# Patient Record
Sex: Female | Born: 1940 | Race: White | Hispanic: No | Marital: Married | Smoking: Never smoker
Health system: Southern US, Community
[De-identification: ages and names within clinical notes are randomized; demographics above are authoritative.]

## PROBLEM LIST (undated history)

## (undated) DIAGNOSIS — IMO0002 Reserved for concepts with insufficient information to code with codable children: Secondary | ICD-10-CM

## (undated) DIAGNOSIS — Z972 Presence of dental prosthetic device (complete) (partial): Secondary | ICD-10-CM

## (undated) DIAGNOSIS — F32A Depression, unspecified: Secondary | ICD-10-CM

## (undated) DIAGNOSIS — G43909 Migraine, unspecified, not intractable, without status migrainosus: Secondary | ICD-10-CM

## (undated) DIAGNOSIS — K219 Gastro-esophageal reflux disease without esophagitis: Secondary | ICD-10-CM

## (undated) DIAGNOSIS — G4730 Sleep apnea, unspecified: Secondary | ICD-10-CM

## (undated) DIAGNOSIS — E119 Type 2 diabetes mellitus without complications: Secondary | ICD-10-CM

## (undated) DIAGNOSIS — H9190 Unspecified hearing loss, unspecified ear: Secondary | ICD-10-CM

## (undated) DIAGNOSIS — R0602 Shortness of breath: Secondary | ICD-10-CM

## (undated) DIAGNOSIS — F329 Major depressive disorder, single episode, unspecified: Secondary | ICD-10-CM

## (undated) DIAGNOSIS — Z973 Presence of spectacles and contact lenses: Secondary | ICD-10-CM

## (undated) DIAGNOSIS — R112 Nausea with vomiting, unspecified: Secondary | ICD-10-CM

## (undated) DIAGNOSIS — F419 Anxiety disorder, unspecified: Secondary | ICD-10-CM

## (undated) DIAGNOSIS — Z9889 Other specified postprocedural states: Secondary | ICD-10-CM

## (undated) DIAGNOSIS — E039 Hypothyroidism, unspecified: Secondary | ICD-10-CM

## (undated) DIAGNOSIS — I10 Essential (primary) hypertension: Secondary | ICD-10-CM

## (undated) DIAGNOSIS — H269 Unspecified cataract: Secondary | ICD-10-CM

## (undated) DIAGNOSIS — Z8719 Personal history of other diseases of the digestive system: Secondary | ICD-10-CM

## (undated) DIAGNOSIS — H409 Unspecified glaucoma: Secondary | ICD-10-CM

## (undated) DIAGNOSIS — M1990 Unspecified osteoarthritis, unspecified site: Secondary | ICD-10-CM

## (undated) HISTORY — PX: MULTIPLE TOOTH EXTRACTIONS: SHX2053

## (undated) HISTORY — PX: COLONOSCOPY: SHX174

## (undated) HISTORY — PX: TUBAL LIGATION: SHX77

## (undated) HISTORY — PX: JOINT REPLACEMENT: SHX530

## (undated) HISTORY — PX: DILATION AND CURETTAGE OF UTERUS: SHX78

## (undated) HISTORY — PX: TONSILLECTOMY: SUR1361

## (undated) HISTORY — PX: CATARACT EXTRACTION: SUR2

## (undated) HISTORY — PX: CARPAL TUNNEL RELEASE: SHX101

## (undated) HISTORY — PX: BACK SURGERY: SHX140

## (undated) HISTORY — PX: SHOULDER ARTHROSCOPY W/ ROTATOR CUFF REPAIR: SHX2400

---

## 2009-01-18 HISTORY — PX: LUMBAR DISC SURGERY: SHX700

## 2013-10-26 ENCOUNTER — Encounter (HOSPITAL_COMMUNITY): Payer: Self-pay | Admitting: Pharmacy Technician

## 2013-10-30 ENCOUNTER — Other Ambulatory Visit (HOSPITAL_COMMUNITY): Payer: Self-pay | Admitting: Orthopaedic Surgery

## 2013-10-31 ENCOUNTER — Encounter (HOSPITAL_COMMUNITY)
Admission: RE | Admit: 2013-10-31 | Discharge: 2013-10-31 | Disposition: A | Discharge status: Home / Self Care | Payer: Medicare PPO | Source: Ambulatory Visit | Attending: Orthopaedic Surgery | Admitting: Orthopaedic Surgery

## 2013-10-31 ENCOUNTER — Encounter (HOSPITAL_COMMUNITY): Payer: Self-pay

## 2013-10-31 DIAGNOSIS — H4089 Other specified glaucoma: Secondary | ICD-10-CM | POA: Diagnosis not present

## 2013-10-31 DIAGNOSIS — M1388 Other specified arthritis, other site: Secondary | ICD-10-CM | POA: Diagnosis not present

## 2013-10-31 DIAGNOSIS — M4310 Spondylolisthesis, site unspecified: Secondary | ICD-10-CM

## 2013-10-31 DIAGNOSIS — G4730 Sleep apnea, unspecified: Secondary | ICD-10-CM | POA: Insufficient documentation

## 2013-10-31 DIAGNOSIS — I10 Essential (primary) hypertension: Secondary | ICD-10-CM | POA: Diagnosis not present

## 2013-10-31 DIAGNOSIS — F329 Major depressive disorder, single episode, unspecified: Secondary | ICD-10-CM | POA: Diagnosis not present

## 2013-10-31 DIAGNOSIS — E119 Type 2 diabetes mellitus without complications: Secondary | ICD-10-CM | POA: Insufficient documentation

## 2013-10-31 DIAGNOSIS — M4806 Spinal stenosis, lumbar region: Secondary | ICD-10-CM | POA: Insufficient documentation

## 2013-10-31 DIAGNOSIS — F419 Anxiety disorder, unspecified: Secondary | ICD-10-CM | POA: Insufficient documentation

## 2013-10-31 DIAGNOSIS — Z0000 Encounter for general adult medical examination without abnormal findings: Secondary | ICD-10-CM | POA: Insufficient documentation

## 2013-10-31 DIAGNOSIS — K219 Gastro-esophageal reflux disease without esophagitis: Secondary | ICD-10-CM | POA: Diagnosis not present

## 2013-10-31 DIAGNOSIS — E039 Hypothyroidism, unspecified: Secondary | ICD-10-CM | POA: Insufficient documentation

## 2013-10-31 HISTORY — DX: Essential (primary) hypertension: I10

## 2013-10-31 HISTORY — DX: Depression, unspecified: F32.A

## 2013-10-31 HISTORY — DX: Unspecified osteoarthritis, unspecified site: M19.90

## 2013-10-31 HISTORY — DX: Hypothyroidism, unspecified: E03.9

## 2013-10-31 HISTORY — DX: Major depressive disorder, single episode, unspecified: F32.9

## 2013-10-31 HISTORY — DX: Personal history of other diseases of the digestive system: Z87.19

## 2013-10-31 HISTORY — DX: Gastro-esophageal reflux disease without esophagitis: K21.9

## 2013-10-31 HISTORY — DX: Sleep apnea, unspecified: G47.30

## 2013-10-31 HISTORY — DX: Anxiety disorder, unspecified: F41.9

## 2013-10-31 HISTORY — DX: Shortness of breath: R06.02

## 2013-10-31 HISTORY — DX: Unspecified glaucoma: H40.9

## 2013-10-31 LAB — COMPREHENSIVE METABOLIC PANEL
ALK PHOS: 81 U/L (ref 39–117)
ALT: 12 U/L (ref 0–35)
AST: 16 U/L (ref 0–37)
Albumin: 3.7 g/dL (ref 3.5–5.2)
Anion gap: 12 (ref 5–15)
BUN: 24 mg/dL — ABNORMAL HIGH (ref 6–23)
CHLORIDE: 101 meq/L (ref 96–112)
CO2: 27 meq/L (ref 19–32)
Calcium: 10.5 mg/dL (ref 8.4–10.5)
Creatinine, Ser: 0.66 mg/dL (ref 0.50–1.10)
GFR calc Af Amer: >90 mL/min (ref >90)
GFR, EST NON AFRICAN AMERICAN: 84 mL/min — AB (ref >90)
Glucose, Bld: 101 mg/dL — ABNORMAL HIGH (ref 70–99)
POTASSIUM: 3.8 meq/L (ref 3.7–5.3)
Sodium: 140 mEq/L (ref 137–147)
Total Protein: 7.5 g/dL (ref 6.0–8.3)

## 2013-10-31 LAB — CBC
HCT: 42 % (ref 36.0–46.0)
Hemoglobin: 14 g/dL (ref 12.0–15.0)
MCH: 29.9 pg (ref 26.0–34.0)
MCHC: 33.3 g/dL (ref 30.0–36.0)
MCV: 89.6 fL (ref 78.0–100.0)
PLATELETS: 214 10*3/uL (ref 150–400)
RBC: 4.69 MIL/uL (ref 3.87–5.11)
RDW: 13.2 % (ref 11.5–15.5)
WBC: 9.9 10*3/uL (ref 4.0–10.5)

## 2013-10-31 LAB — PROTIME-INR
INR: 0.99 (ref 0.00–1.49)
PROTHROMBIN TIME: 13.2 s (ref 11.6–15.2)

## 2013-10-31 LAB — SURGICAL PCR SCREEN
MRSA, PCR: NEGATIVE
Staphylococcus aureus: NEGATIVE

## 2013-10-31 NOTE — Progress Notes (Signed)
Pt. Never been seen by cardiologist, states she had a baseline stress test [place] y[place]. [place]go [place]  Pt. Se[place] by PCP- [place]. Valla [place]ockley [place] Perhaps had an EKG at Dr. Aline AugustShockley's office, pt. Doesn't know how long ago it was. Sleep study- 3 yrs. Ago or more, no longer using CPAP for apnea.

## 2013-10-31 NOTE — Pre-Procedure Instructions (Signed)
[place]  10/31/2013   Your procedure is scheduled on:  11/05/2013  [facility] at 10:30 AM.  Call this number if you have problems the morning of surgery: [phone]   Remember:   Do not eat food or drink liquids after midnight.  On Sunday NIGHT   Take these medicines the morning of surgery with A SIP OF WATER: clonazepam, pain medicine is OK if needed morning of surgery , synthroid, prilosec, Effexor   Do not wear jewelry, make-up or nail polish.  Do not wear lotions, powders, or perfumes. You may wear deodorant.  Do not shave 48 hours prior to surgery.   Do not bring valuables to the hospital.  [facility] is not responsible                  for any belongings or valuables.               Contacts, dentures or bridgework may not be worn into surgery.  Leave suitcase in the car. After surgery it may be brought to your room.  For patients admitted to the hospital, discharge time is determined by your                treatment team.               Patients discharged the day of surgery will not be allowed to drive  home.  Name and phone number of your driver: /w family  Special Instructions: Special Instructions: [facility] [place] - Preparing for Surgery  Before surgery, you can play an important role.  Because skin is not sterile, your skin needs to be as free of germs as possible.  You can reduce the number of germs on you skin by washing with CHG (chlorahexidine gluconate) soap before surgery.  CHG is an antiseptic cleaner which kills germs and bonds with the skin to continue killing germs even after washing.  Please DO NOT use if you have an allergy to CHG or antibacterial soaps.  If your skin becomes reddened/irritated stop using the CHG and inform your nurse when you arrive at Short Stay.  Do not shave (including legs and underarms) for at least 48 hours prior to the first CHG shower.  You may shave your face.  Please follow these instructions  carefully:   1.  Shower with CHG Soap the night before surgery and the  morning of Surgery.  2.  If you choose to wash your hair, wash your hair first as usual with your  normal shampoo.  3.  After you shampoo, rinse your hair and body thoroughly to remove the  Shampoo.  4.  Use CHG as you would any other liquid soap.  You can apply chg directly to the skin and wash gently with scrungie or a clean washcloth.  5.  Apply the CHG Soap to your body ONLY FROM THE NECK DOWN.    Do not use on open wounds or open sores.  Avoid contact with your eyes, ears, mouth and genitals (private parts).  Wash genitals (private parts)   with your normal soap.  6.  Wash thoroughly, paying special attention to the area where your surgery will be performed.  7.  Thoroughly rinse your body with warm water from the neck down.  8.  DO NOT shower/wash with your normal soap after using and rinsing off   the CHG Soap.  9.  Pat yourself dry with a clean towel.  10.  Wear clean pajamas.            11.  Place clean sheets on your bed the night of your first shower and do not sleep with pets.  Day of Surgery  Do not apply any lotions/deodorants the morning of surgery.  Please wear clean clothes to the hospital/surgery center.   Please read over the following fact sheets that you were given: Pain Booklet, Coughing and Deep Breathing, MRSA Information and Surgical Site Infection Prevention

## 2013-10-31 NOTE — H&P (Signed)
[facility]   A Division of [facility]   [address], [place], [place] [address] Telephone: [phone]  Fax: [phone]     PATIENT: Amy Curtis, Amy Curtis   MR#: [id number]  DOB: Dec 13, 1940   Visit Date: 10/25/2013     A 73 year old female returns and states her back is giving her severe problems.  She is having difficulty walking and has a cane.  Whenever she turns or shifts she feels sharp electrical type pain that shoots down to her buttocks and into her anterior thighs stopping by her knees and occasionally goes down past her knees to her foot.  She had an injection in her shoulder that gave her some improvement.  She has known full-thickness rotator cuff on the right shoulder with retraction.  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  She denies any associated bowel or bladder symptoms other than having difficulty getting on and off the toilet.   MEDICATIONS:  Patient's med list was reviewed and is identical to 03/29/2013 including Relafen 750 mg b.i.d., lisinopril 20 mg daily, venlafaxine 75 mg 1 tablet b.i.d., levothyroxine 25 mcg 1 each morning, omeprazole 40 mg daily, carvodopa b.i.d., verapamil HCL 180 mg 1 p.o. b.i.d., chlorthalidone 1 p.o. daily 25 mg, Xalatan 0.005% one eyedrop each eye, dorzolamide HCL ophthalmic drops 1 drop each eye every morning 2.24%/0.68% and hydrocodone 10/325 one tablet daily occasionally a second tablet.   PAST SURGICAL HISTORY:  Include L4-5 surgery 5 years ago [place] with good relief with the severe pain and inability to ambulate secondary to recurrent symptoms, tonsillectomy age 89, D&C when she had menopause, rotator cuff surgery and carpal tunnel.   FAMILY HISTORY:  Positive for brother with heart disease, mother with lung disease.   SOCIAL HISTORY:  She is married to her husband Renae Fickleaul who is not in good shape.  She does not smoke or drink.  She has to take care of her husband.   REVIEW OF SYSTEMS:  Positive  for essential tremor, acid reflux, anxiety, arthritis, bronchitis, depression, diabetes, glaucoma, hypertension, sleep apnea and thyroid condition.   PHYSICAL EXAMINATION:  Patient is alert, oriented.  Height 5 feet 4 inches, weight 250, BP 146/80.  No brachial plexus tenderness right or left.  Positive Spurling mild-to-moderate.  Reflexes are 2+ upper extremities.  Healed carpal tunnel incision.  She ambulates with a flexed position and does better when she walks with a grocery cart.  Is ambulatory with a cane.  No pain with hip range of motion.  Quad testing in a sitting position is strong.   EHL anterior tib is strong.  Distal pulses are 2+.   RADIOGRAPHS:  MRI lumbar demonstrates severe stenosis at L3-4 level.  She has postop changes at 4-5 on the right side.   PLAN:  We discussed options.  She understands it is unlikely epidural will give her any relief since this is severe spinal stenosis.  She does have some left foraminal stenosis which is of some concern.  Her symptoms are consistent with neurogenic claudication and not foraminal stenosis.  We discussed options.  Plan would be single level decompression at the L3-4 level, overnight stay in the hospital, use of a walker afterwards.  She would need to have an adult available after she was discharged to help.  Questions answered.  She will consider her options and call us if she would like to proceed.   For additional information please see handwritten notes, reports, orders and prescriptions in this chart.  Chapman Matteucci C. Ophelia CharterYates, M.D.    Auto-Authenticated by Veverly FellsMark C. Ophelia CharterYates, M.D.

## 2013-11-04 MED ORDER — CEFAZOLIN SODIUM-DEXTROSE 2-3 GM-% IV SOLR: 2.0000 g | INTRAVENOUS | Status: DC

## 2013-11-05 ENCOUNTER — Encounter (HOSPITAL_COMMUNITY): Payer: Medicare PPO | Admitting: Vascular Surgery

## 2013-11-05 ENCOUNTER — Inpatient Hospital Stay (HOSPITAL_COMMUNITY): Payer: Medicare PPO | Admitting: Certified Registered Nurse Anesthetist

## 2013-11-05 ENCOUNTER — Observation Stay (HOSPITAL_COMMUNITY)
Admission: RE | Admit: 2013-11-05 | Discharge: 2013-11-06 | Disposition: A | Discharge status: Home / Self Care | Payer: Medicare PPO | Source: Ambulatory Visit | Attending: Orthopaedic Surgery | Admitting: Orthopaedic Surgery

## 2013-11-05 ENCOUNTER — Encounter (HOSPITAL_COMMUNITY): Payer: Self-pay | Admitting: Certified Registered Nurse Anesthetist

## 2013-11-05 ENCOUNTER — Encounter (HOSPITAL_COMMUNITY)
Admission: RE | Disposition: A | Discharge status: Home / Self Care | Payer: Self-pay | Source: Ambulatory Visit | Attending: Orthopaedic Surgery

## 2013-11-05 ENCOUNTER — Inpatient Hospital Stay (HOSPITAL_COMMUNITY): Payer: Medicare PPO

## 2013-11-05 DIAGNOSIS — G4730 Sleep apnea, unspecified: Secondary | ICD-10-CM | POA: Diagnosis not present

## 2013-11-05 DIAGNOSIS — E119 Type 2 diabetes mellitus without complications: Secondary | ICD-10-CM | POA: Insufficient documentation

## 2013-11-05 DIAGNOSIS — M545 Low back pain, unspecified: Secondary | ICD-10-CM

## 2013-11-05 DIAGNOSIS — M48062 Spinal stenosis, lumbar region with neurogenic claudication: Secondary | ICD-10-CM | POA: Diagnosis present

## 2013-11-05 DIAGNOSIS — M1990 Unspecified osteoarthritis, unspecified site: Secondary | ICD-10-CM | POA: Diagnosis not present

## 2013-11-05 DIAGNOSIS — K219 Gastro-esophageal reflux disease without esophagitis: Secondary | ICD-10-CM | POA: Insufficient documentation

## 2013-11-05 DIAGNOSIS — I10 Essential (primary) hypertension: Secondary | ICD-10-CM | POA: Insufficient documentation

## 2013-11-05 DIAGNOSIS — M4806 Spinal stenosis, lumbar region: Principal | ICD-10-CM | POA: Insufficient documentation

## 2013-11-05 DIAGNOSIS — M48061 Spinal stenosis, lumbar region without neurogenic claudication: Secondary | ICD-10-CM | POA: Diagnosis present

## 2013-11-05 DIAGNOSIS — K449 Diaphragmatic hernia without obstruction or gangrene: Secondary | ICD-10-CM | POA: Diagnosis not present

## 2013-11-05 DIAGNOSIS — M5442 Lumbago with sciatica, left side: Secondary | ICD-10-CM

## 2013-11-05 HISTORY — PX: LUMBAR LAMINECTOMY: SHX95

## 2013-11-05 LAB — GLUCOSE, CAPILLARY
GLUCOSE-CAPILLARY: 124 mg/dL — AB (ref 70–99)
GLUCOSE-CAPILLARY: 123 mg/dL — AB (ref 70–99)
Glucose-Capillary: 100 mg/dL — ABNORMAL HIGH (ref 70–99)
Glucose-Capillary: 134 mg/dL — ABNORMAL HIGH (ref 70–99)

## 2013-11-05 SURGERY — MICRODISCECTOMY LUMBAR LAMINECTOMY
Anesthesia: General

## 2013-11-05 MED ORDER — VENLAFAXINE HCL 75 MG PO TABS
150.0000 mg | ORAL_TABLET | Freq: Every day | ORAL | Status: DC
  Administered 2013-11-06: 150 mg via ORAL
  Filled 2013-11-05: qty 2

## 2013-11-05 MED ORDER — LIDOCAINE HCL (CARDIAC) 20 MG/ML IV SOLN
INTRAVENOUS | Status: DC | PRN
  Administered 2013-11-05: 60 mg via INTRAVENOUS

## 2013-11-05 MED ORDER — ARTIFICIAL TEARS OP OINT
TOPICAL_OINTMENT | OPHTHALMIC | Status: DC | PRN
  Administered 2013-11-05: 1 via OPHTHALMIC

## 2013-11-05 MED ORDER — CLONAZEPAM 0.5 MG PO TABS: 0.5000 mg | ORAL_TABLET | Freq: Two times a day (BID) | ORAL | Status: DC | PRN

## 2013-11-05 MED ORDER — THROMBIN 20000 UNITS EX SOLR
CUTANEOUS | Status: AC
  Filled 2013-11-05: qty 20000

## 2013-11-05 MED ORDER — ZOLPIDEM TARTRATE 5 MG PO TABS: 5.0000 mg | ORAL_TABLET | Freq: Every evening | ORAL | Status: DC | PRN

## 2013-11-05 MED ORDER — ONDANSETRON HCL 4 MG/2ML IJ SOLN
INTRAMUSCULAR | Status: AC
  Filled 2013-11-05: qty 2

## 2013-11-05 MED ORDER — LIDOCAINE HCL (CARDIAC) 20 MG/ML IV SOLN
INTRAVENOUS | Status: AC
  Filled 2013-11-05: qty 5

## 2013-11-05 MED ORDER — LIDOCAINE HCL 4 % MT SOLN
OROMUCOSAL | Status: DC | PRN
  Administered 2013-11-05: 4 mL via TOPICAL

## 2013-11-05 MED ORDER — HYDROMORPHONE HCL 1 MG/ML IJ SOLN
INTRAMUSCULAR | Status: AC
  Administered 2013-11-05: 0.5 mg via INTRAVENOUS
  Filled 2013-11-05: qty 2

## 2013-11-05 MED ORDER — MENTHOL 3 MG MT LOZG: 1.0000 | LOZENGE | OROMUCOSAL | Status: DC | PRN

## 2013-11-05 MED ORDER — OXYCODONE-ACETAMINOPHEN 5-325 MG PO TABS
1.0000 | ORAL_TABLET | ORAL | Status: DC | PRN
  Administered 2013-11-05: 2 via ORAL

## 2013-11-05 MED ORDER — LATANOPROST 0.005 % OP SOLN
1.0000 [drp] | Freq: Every day | OPHTHALMIC | Status: DC
  Administered 2013-11-05: 1 [drp] via OPHTHALMIC
  Filled 2013-11-05: qty 2.5

## 2013-11-05 MED ORDER — METHOCARBAMOL 500 MG PO TABS
ORAL_TABLET | ORAL | Status: AC
  Administered 2013-11-05: 500 mg via ORAL
  Filled 2013-11-05: qty 1

## 2013-11-05 MED ORDER — ONDANSETRON HCL 4 MG/2ML IJ SOLN
4.0000 mg | Freq: Once | INTRAMUSCULAR | Status: DC | PRN
Start: 2013-11-05 — End: 2013-11-05

## 2013-11-05 MED ORDER — LABETALOL HCL 5 MG/ML IV SOLN
INTRAVENOUS | Status: DC | PRN
  Administered 2013-11-05 (×2): 5 mg via INTRAVENOUS

## 2013-11-05 MED ORDER — NABUMETONE 750 MG PO TABS
750.0000 mg | ORAL_TABLET | Freq: Two times a day (BID) | ORAL | Status: DC | PRN
  Filled 2013-11-05: qty 1

## 2013-11-05 MED ORDER — SODIUM CHLORIDE 0.9 % IJ SOLN: 3.0000 mL | INTRAMUSCULAR | Status: DC | PRN

## 2013-11-05 MED ORDER — PANTOPRAZOLE SODIUM 40 MG PO TBEC
80.0000 mg | DELAYED_RELEASE_TABLET | Freq: Every day | ORAL | Status: DC
  Administered 2013-11-06: 80 mg via ORAL
  Filled 2013-11-05: qty 2

## 2013-11-05 MED ORDER — ACETAMINOPHEN 650 MG RE SUPP: 650.0000 mg | RECTAL | Status: DC | PRN

## 2013-11-05 MED ORDER — KETOROLAC TROMETHAMINE 30 MG/ML IJ SOLN
INTRAMUSCULAR | Status: AC
Start: 2013-11-05 — End: 2013-11-06
  Filled 2013-11-05: qty 1

## 2013-11-05 MED ORDER — GLYCOPYRROLATE 0.2 MG/ML IJ SOLN
INTRAMUSCULAR | Status: DC | PRN
  Administered 2013-11-05: 1 mg via INTRAVENOUS

## 2013-11-05 MED ORDER — BRIMONIDINE TARTRATE 0.2 % OP SOLN
2.0000 [drp] | Freq: Two times a day (BID) | OPHTHALMIC | Status: DC
  Administered 2013-11-06: 2 [drp] via OPHTHALMIC
  Filled 2013-11-05: qty 5

## 2013-11-05 MED ORDER — SODIUM CHLORIDE 0.9 % IJ SOLN: 3.0000 mL | Freq: Two times a day (BID) | INTRAMUSCULAR | Status: DC

## 2013-11-05 MED ORDER — ONDANSETRON HCL 4 MG/2ML IJ SOLN
INTRAMUSCULAR | Status: DC | PRN
  Administered 2013-11-05: 4 mg via INTRAVENOUS

## 2013-11-05 MED ORDER — ROCURONIUM BROMIDE 100 MG/10ML IV SOLN
INTRAVENOUS | Status: DC | PRN
  Administered 2013-11-05: 40 mg via INTRAVENOUS
  Administered 2013-11-05: 10 mg via INTRAVENOUS

## 2013-11-05 MED ORDER — CEFAZOLIN SODIUM 1-5 GM-% IV SOLN
1.0000 g | Freq: Three times a day (TID) | INTRAVENOUS | Status: AC
  Administered 2013-11-05 – 2013-11-06 (×2): 1 g via INTRAVENOUS
  Filled 2013-11-05 (×2): qty 50

## 2013-11-05 MED ORDER — ROCURONIUM BROMIDE 50 MG/5ML IV SOLN
INTRAVENOUS | Status: AC
  Filled 2013-11-05: qty 1

## 2013-11-05 MED ORDER — CHLORTHALIDONE 25 MG PO TABS
25.0000 mg | ORAL_TABLET | Freq: Every day | ORAL | Status: DC
  Administered 2013-11-06: 25 mg via ORAL
  Filled 2013-11-05 (×2): qty 1

## 2013-11-05 MED ORDER — THROMBIN 20000 UNITS EX SOLR
CUTANEOUS | Status: DC | PRN
  Administered 2013-11-05: 15:00:00 via TOPICAL

## 2013-11-05 MED ORDER — SENNOSIDES-DOCUSATE SODIUM 8.6-50 MG PO TABS
1.0000 | ORAL_TABLET | Freq: Every evening | ORAL | Status: DC | PRN
  Filled 2013-11-05: qty 1

## 2013-11-05 MED ORDER — BRINZOLAMIDE 1 % OP SUSP
1.0000 [drp] | Freq: Two times a day (BID) | OPHTHALMIC | Status: DC
  Administered 2013-11-06: 1 [drp] via OPHTHALMIC
  Filled 2013-11-05: qty 10

## 2013-11-05 MED ORDER — FLEET ENEMA 7-19 GM/118ML RE ENEM
1.0000 | ENEMA | Freq: Once | RECTAL | Status: AC | PRN
  Filled 2013-11-05: qty 1

## 2013-11-05 MED ORDER — VENLAFAXINE HCL 75 MG PO TABS: 75.0000 mg | ORAL_TABLET | Freq: Two times a day (BID) | ORAL | Status: DC

## 2013-11-05 MED ORDER — FENTANYL CITRATE 0.05 MG/ML IJ SOLN
INTRAMUSCULAR | Status: DC | PRN
  Administered 2013-11-05 (×3): 50 ug via INTRAVENOUS
  Administered 2013-11-05 (×2): 25 ug via INTRAVENOUS
  Administered 2013-11-05: 50 ug via INTRAVENOUS
  Administered 2013-11-05: 100 ug via INTRAVENOUS
  Administered 2013-11-05: 25 ug via INTRAVENOUS
  Administered 2013-11-05: 50 ug via INTRAVENOUS
  Administered 2013-11-05 (×3): 25 ug via INTRAVENOUS

## 2013-11-05 MED ORDER — METHOCARBAMOL 1000 MG/10ML IJ SOLN
500.0000 mg | Freq: Four times a day (QID) | INTRAVENOUS | Status: DC | PRN
  Filled 2013-11-05: qty 5

## 2013-11-05 MED ORDER — LACTATED RINGERS IV SOLN
INTRAVENOUS | Status: DC | PRN
  Administered 2013-11-05 (×2): via INTRAVENOUS

## 2013-11-05 MED ORDER — OXYCODONE-ACETAMINOPHEN 5-325 MG PO TABS
ORAL_TABLET | ORAL | Status: AC
  Administered 2013-11-05: 2 via ORAL
  Filled 2013-11-05: qty 2

## 2013-11-05 MED ORDER — PROPOFOL 10 MG/ML IV BOLUS
INTRAVENOUS | Status: DC | PRN
  Administered 2013-11-05: 20 mg via INTRAVENOUS
  Administered 2013-11-05: 180 mg via INTRAVENOUS

## 2013-11-05 MED ORDER — FENTANYL CITRATE 0.05 MG/ML IJ SOLN
INTRAMUSCULAR | Status: AC
  Filled 2013-11-05: qty 5

## 2013-11-05 MED ORDER — HYDROCODONE-ACETAMINOPHEN 5-325 MG PO TABS
1.0000 | ORAL_TABLET | ORAL | Status: DC | PRN
  Administered 2013-11-05 – 2013-11-06 (×2): 2 via ORAL
  Filled 2013-11-05 (×2): qty 2

## 2013-11-05 MED ORDER — PROPOFOL 10 MG/ML IV BOLUS
INTRAVENOUS | Status: AC
  Filled 2013-11-05: qty 20

## 2013-11-05 MED ORDER — DOCUSATE SODIUM 100 MG PO CAPS
100.0000 mg | ORAL_CAPSULE | Freq: Two times a day (BID) | ORAL | Status: DC
  Administered 2013-11-05 – 2013-11-06 (×2): 100 mg via ORAL
  Filled 2013-11-05 (×3): qty 1

## 2013-11-05 MED ORDER — BISACODYL 10 MG RE SUPP: 10.0000 mg | Freq: Every day | RECTAL | Status: DC | PRN

## 2013-11-05 MED ORDER — ONDANSETRON HCL 4 MG/2ML IJ SOLN: 4.0000 mg | INTRAMUSCULAR | Status: DC | PRN

## 2013-11-05 MED ORDER — CEFAZOLIN SODIUM-DEXTROSE 2-3 GM-% IV SOLR
2.0000 g | INTRAVENOUS | Status: AC
  Administered 2013-11-05: 2 g via INTRAVENOUS

## 2013-11-05 MED ORDER — THROMBIN 20000 UNITS EX SOLR: CUTANEOUS | Status: DC | PRN

## 2013-11-05 MED ORDER — VENLAFAXINE HCL 75 MG PO TABS
75.0000 mg | ORAL_TABLET | Freq: Every day | ORAL | Status: DC
  Administered 2013-11-05: 75 mg via ORAL
  Filled 2013-11-05 (×2): qty 1

## 2013-11-05 MED ORDER — SODIUM CHLORIDE 0.9 % IV SOLN: 250.0000 mL | INTRAVENOUS | Status: DC

## 2013-11-05 MED ORDER — 0.9 % SODIUM CHLORIDE (POUR BTL) OPTIME
TOPICAL | Status: DC | PRN
  Administered 2013-11-05: 1000 mL

## 2013-11-05 MED ORDER — ACETAMINOPHEN 325 MG PO TABS: 650.0000 mg | ORAL_TABLET | ORAL | Status: DC | PRN

## 2013-11-05 MED ORDER — VERAPAMIL HCL ER 180 MG PO TBCR
180.0000 mg | EXTENDED_RELEASE_TABLET | Freq: Two times a day (BID) | ORAL | Status: DC
  Administered 2013-11-05 – 2013-11-06 (×2): 180 mg via ORAL
  Filled 2013-11-05 (×3): qty 1

## 2013-11-05 MED ORDER — LISINOPRIL 20 MG PO TABS
20.0000 mg | ORAL_TABLET | Freq: Every day | ORAL | Status: DC
  Administered 2013-11-05 – 2013-11-06 (×2): 20 mg via ORAL
  Filled 2013-11-05 (×2): qty 1

## 2013-11-05 MED ORDER — NEOSTIGMINE METHYLSULFATE 10 MG/10ML IV SOLN
INTRAVENOUS | Status: DC | PRN
Start: 2013-11-05 — End: 2013-11-05
  Administered 2013-11-05: 5 mg via INTRAVENOUS

## 2013-11-05 MED ORDER — CEFAZOLIN SODIUM-DEXTROSE 2-3 GM-% IV SOLR
INTRAVENOUS | Status: AC
  Filled 2013-11-05: qty 50

## 2013-11-05 MED ORDER — OXYCODONE-ACETAMINOPHEN 5-325 MG PO TABS: 1.0000 | ORAL_TABLET | ORAL | Status: DC | PRN

## 2013-11-05 MED ORDER — KCL IN DEXTROSE-NACL 20-5-0.45 MEQ/L-%-% IV SOLN
INTRAVENOUS | Status: DC
  Filled 2013-11-05 (×3): qty 1000

## 2013-11-05 MED ORDER — KETOROLAC TROMETHAMINE 30 MG/ML IJ SOLN
30.0000 mg | Freq: Once | INTRAMUSCULAR | Status: AC
  Administered 2013-11-05: 30 mg via INTRAVENOUS

## 2013-11-05 MED ORDER — BUPIVACAINE HCL (PF) 0.25 % IJ SOLN
INTRAMUSCULAR | Status: AC
  Filled 2013-11-05: qty 30

## 2013-11-05 MED ORDER — METHOCARBAMOL 500 MG PO TABS
500.0000 mg | ORAL_TABLET | Freq: Four times a day (QID) | ORAL | Status: DC | PRN
  Administered 2013-11-05: 500 mg via ORAL

## 2013-11-05 MED ORDER — LEVOTHYROXINE SODIUM 25 MCG PO TABS
25.0000 ug | ORAL_TABLET | Freq: Every day | ORAL | Status: DC
  Administered 2013-11-06: 25 ug via ORAL
  Filled 2013-11-05 (×2): qty 1

## 2013-11-05 MED ORDER — MORPHINE SULFATE 2 MG/ML IJ SOLN: 1.0000 mg | INTRAMUSCULAR | Status: DC | PRN

## 2013-11-05 MED ORDER — PHENOL 1.4 % MT LIQD: 1.0000 | OROMUCOSAL | Status: DC | PRN

## 2013-11-05 MED ORDER — HYDROMORPHONE HCL 1 MG/ML IJ SOLN
0.2500 mg | INTRAMUSCULAR | Status: DC | PRN
  Administered 2013-11-05 (×4): 0.5 mg via INTRAVENOUS

## 2013-11-05 MED ORDER — LACTATED RINGERS IV SOLN
INTRAVENOUS | Status: DC
  Administered 2013-11-05: 11:00:00 via INTRAVENOUS

## 2013-11-05 SURGICAL SUPPLY — 53 items
BUR ROUND FLUTED 4 SOFT TCH (BURR) ×2 IMPLANT
CORDS BIPOLAR (ELECTRODE) ×2 IMPLANT
COVER SURGICAL LIGHT HANDLE (MISCELLANEOUS) ×2 IMPLANT
DERMABOND ADVANCED (GAUZE/BANDAGES/DRESSINGS) ×1
DERMABOND ADVANCED .7 DNX12 (GAUZE/BANDAGES/DRESSINGS) ×1 IMPLANT
DRAPE MICROSCOPE LEICA (MISCELLANEOUS) ×2 IMPLANT
DRAPE PROXIMA HALF (DRAPES) ×4 IMPLANT
DRSG EMULSION OIL 3X3 NADH (GAUZE/BANDAGES/DRESSINGS) IMPLANT
DRSG MEPILEX BORDER 4X4 (GAUZE/BANDAGES/DRESSINGS) IMPLANT
DRSG MEPILEX BORDER 4X8 (GAUZE/BANDAGES/DRESSINGS) ×2 IMPLANT
DURAPREP 26ML APPLICATOR (WOUND CARE) ×2 IMPLANT
DURASEAL SPINE SEALANT 3ML (MISCELLANEOUS) IMPLANT
ELECT REM PT RETURN 9FT ADLT (ELECTROSURGICAL) ×2
ELECTRODE REM PT RTRN 9FT ADLT (ELECTROSURGICAL) ×1 IMPLANT
GAUZE SPONGE 4X4 12PLY STRL (GAUZE/BANDAGES/DRESSINGS) IMPLANT
GLOVE BIO SURGEON STRL SZ 6 (GLOVE) ×2 IMPLANT
GLOVE BIO SURGEON STRL SZ7 (GLOVE) ×2 IMPLANT
GLOVE BIOGEL PI IND STRL 6.5 (GLOVE) ×1 IMPLANT
GLOVE BIOGEL PI IND STRL 7.5 (GLOVE) ×1 IMPLANT
GLOVE BIOGEL PI IND STRL 8 (GLOVE) ×1 IMPLANT
GLOVE BIOGEL PI INDICATOR 6.5 (GLOVE) ×1
GLOVE BIOGEL PI INDICATOR 7.5 (GLOVE) ×1
GLOVE BIOGEL PI INDICATOR 8 (GLOVE) ×1
GLOVE BIOGEL PI ORTHO PRO SZ7 (GLOVE) ×1
GLOVE ECLIPSE 7.0 STRL STRAW (GLOVE) ×2 IMPLANT
GLOVE ORTHO TXT STRL SZ7.5 (GLOVE) ×2 IMPLANT
GLOVE PI ORTHO PRO STRL SZ7 (GLOVE) ×1 IMPLANT
GLOVE SURG SS PI 6.5 STRL IVOR (GLOVE) ×2 IMPLANT
GOWN STRL REUS W/ TWL LRG LVL3 (GOWN DISPOSABLE) ×4 IMPLANT
GOWN STRL REUS W/TWL LRG LVL3 (GOWN DISPOSABLE) ×4
KIT BASIN OR (CUSTOM PROCEDURE TRAY) ×2 IMPLANT
KIT ROOM TURNOVER OR (KITS) ×2 IMPLANT
MANIFOLD NEPTUNE II (INSTRUMENTS) IMPLANT
NDL SUT .5 MAYO 1.404X.05X (NEEDLE) ×1 IMPLANT
NEEDLE 22X1 1/2 (OR ONLY) (NEEDLE) ×2 IMPLANT
NEEDLE MAYO TAPER (NEEDLE) ×1
NEEDLE SPNL 18GX3.5 QUINCKE PK (NEEDLE) ×2 IMPLANT
NS IRRIG 1000ML POUR BTL (IV SOLUTION) ×2 IMPLANT
PACK LAMINECTOMY ORTHO (CUSTOM PROCEDURE TRAY) ×2 IMPLANT
PAD ARMBOARD 7.5X6 YLW CONV (MISCELLANEOUS) ×4 IMPLANT
PATTIES SURGICAL .5 X.5 (GAUZE/BANDAGES/DRESSINGS) ×2 IMPLANT
PATTIES SURGICAL .75X.75 (GAUZE/BANDAGES/DRESSINGS) ×2 IMPLANT
SPONGE SURGIFOAM ABS GEL 100 (HEMOSTASIS) ×2 IMPLANT
SUT VIC AB 2-0 CT1 27 (SUTURE) ×3
SUT VIC AB 2-0 CT1 TAPERPNT 27 (SUTURE) ×3 IMPLANT
SUT VICRYL 0 TIES 12 18 (SUTURE) ×2 IMPLANT
SUT VICRYL 4-0 PS2 18IN ABS (SUTURE) ×2 IMPLANT
SUT VICRYL AB 2 0 TIES (SUTURE) ×2 IMPLANT
SYR 20ML ECCENTRIC (SYRINGE) ×2 IMPLANT
SYR CONTROL 10ML LL (SYRINGE) ×2 IMPLANT
TOWEL OR 17X24 6PK STRL BLUE (TOWEL DISPOSABLE) ×2 IMPLANT
TOWEL OR 17X26 10 PK STRL BLUE (TOWEL DISPOSABLE) ×2 IMPLANT
WATER STERILE IRR 1000ML POUR (IV SOLUTION) IMPLANT

## 2013-11-05 NOTE — Interval H&P Note (Signed)
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 After consideration of risks, benefits and other options for treatment, the patient has consented to  Procedure(s): L3-4 Decompression (N/A) as a surgical intervention .  The patient's history has been reviewed, patient examined, no change in status, stable for surgery.  I have reviewed the patient's chart and labs.  Questions were answered to the patient's satisfaction.     Zineb Glade C

## 2013-11-05 NOTE — Brief Op Note (Cosign Needed)
11/05/2013  3:35 PM  PATIENT:  Amy Curtis  73 y.o. 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   Delay start of Pharmacological VTE agent (>24hrs) due to surgical blood loss or risk of bleeding: yes

## 2013-11-05 NOTE — Anesthesia Postprocedure Evaluation (Signed)
Anesthesia Post Note  Patient: Amy Curtis  Procedure(s) Performed: Procedure(s) (LRB): L3-4 Decompression (N/A)  Anesthesia type: general  Patient location: PACU  Post pain: Pain level controlled  Post assessment: Patient's Cardiovascular Status Stable  Last Vitals:  Filed Vitals:   11/05/13 1600  BP: 129/86  Pulse: 79  Temp:   Resp: 26    Post vital signs: Reviewed and stable  Level of consciousness: sedated  Complications: No apparent anesthesia complications

## 2013-11-05 NOTE — Anesthesia Preprocedure Evaluation (Addendum)
Anesthesia Evaluation  Patient identified by MRN, date of birth, ID band Patient awake    Reviewed: Allergy & Precautions, H&P , NPO status , Patient's Chart, lab work & pertinent test results  Airway Mallampati: II TM Distance: >3 FB Neck ROM: Full    Dental  (+) Edentulous Lower, Edentulous Upper   Pulmonary sleep apnea ,          Cardiovascular hypertension, Rhythm:Regular Rate:Normal     Neuro/Psych    GI/Hepatic hiatal hernia, GERD-  ,  Endo/Other  diabetes, Type 2Hypothyroidism   Renal/GU      Musculoskeletal  (+) Arthritis -,   Abdominal   Peds  Hematology   Anesthesia Other Findings   Reproductive/Obstetrics                          Anesthesia Physical Anesthesia Plan  ASA: III  Anesthesia Plan: General   Post-op Pain Management:    Induction: Intravenous  Airway Management Planned: Oral ETT  Additional Equipment:   Intra-op Plan:   Post-operative Plan: Extubation in OR  Informed Consent: I have reviewed the patients History and Physical, chart, labs and discussed the procedure including the risks, benefits and alternatives for the proposed anesthesia with the patient or authorized representative who has indicated his/her understanding and acceptance.     Plan Discussed with: CRNA, Anesthesiologist and Surgeon  Anesthesia Plan Comments:         Anesthesia Quick Evaluation

## 2013-11-05 NOTE — Transfer of Care (Signed)
Immediate Anesthesia Transfer of Care Note  Patient: Amy Curtis  Procedure(s) Performed: Procedure(s): L3-4 Decompression (N/A)  Patient Location: PACU  Anesthesia Type:General  Level of Consciousness: awake, alert  and oriented  Airway & Oxygen Therapy: Patient Spontanous Breathing  Post-op Assessment: Report given to PACU RN  Post vital signs: Reviewed and stable  Complications: No apparent anesthesia complications

## 2013-11-05 NOTE — Discharge Instructions (Signed)
    No lifting greater than 10 lbs. Avoid bending, stooping and twisting. Walk in house for first week them may start to get out slowly increasing distance up to one mile by 3 weeks post op. Keep incision dry for 3 days, may use tegaderm or similar water impervious dressing.  

## 2013-11-06 ENCOUNTER — Encounter (HOSPITAL_COMMUNITY): Payer: Self-pay | Admitting: Orthopaedic Surgery

## 2013-11-06 DIAGNOSIS — M4806 Spinal stenosis, lumbar region: Secondary | ICD-10-CM | POA: Diagnosis not present

## 2013-11-06 LAB — GLUCOSE, CAPILLARY: Glucose-Capillary: 120 mg/dL — ABNORMAL HIGH (ref 70–99)

## 2013-11-06 NOTE — Op Note (Signed)
NAMFleet Amy Curtis, Amy Curtis              ACCOUNT NO.:  [id number]  MEDICAL RECORD NO.:  [id number]  LOCATION:  3C06C                        FACILITY:  [facility]  PHYSICIAN:  Taylar Hartsough C. Ophelia CharterYates, M.D.    DATE OF BIRTH:  24-Nov-1940  DATE OF PROCEDURE:  11/05/2013 DATE OF DISCHARGE:                              OPERATIVE REPORT   PREOPERATIVE DIAGNOSIS:  Spinal stenosis, L3-4.  POSTOPERATIVE DIAGNOSIS:  Spinal stenosis, L3-4.  PROCEDURE:  L3-4 central decompression, lateral recess decompression.  SURGEON:  Oluwatoni Rotunno C. Ophelia CharterYates, M.D.  ASSISTANT:  Wende NeighborsSheila M. Vernon, PA-C, medically necessary and present for the entire procedure.  ESTIMATED BLOOD LOSS:  200 mL.  COMPLICATIONS:  None.  INDICATIONS:  A 73 year old female, previous surgery at L4 5 years ago, which had done well.  She had developed progressive neurogenic claudication symptoms, spinal stenosis with lateral recess stenosis, severe and severe central stenosis failing conservative treatment.  MRI scan documented severe stenosis at the 3-4 level.  DESCRIPTION OF PROCEDURE:  After induction of general anesthesia, the patient was placed prone, table had been angled to the patient's extremely large size trying to keep for back parallel to the floor. Head was elevated with good blood pressure, take pressures of her eyes and roll pads placed anterior to her shoulder.  She had previous right shoulder surgery in the past with decreased range of motion of her shoulder.  Area was squared with towels, Betadine, Steri-Drape applied after sterile skin marker used for the old incision.  Large C-arm was brought in for visualization and incision was made using the old incision proximal to the palpable superior iliac crest on each side, subperiosteal dissection and then Kocher clamps at the planned level of decompression.  These two clamps were at 2-3.  Upper clamp was then moved and the tourniquet was then placed one level below so that it was in line  above and below the 3-4 interspace.  AP was checked as well. Self-retaining retractor was placed.  Posterior element spinous process was removed with the Kerrison rongeur.  Lamina was pinned with the bur at the top portion lamina was left since the patient had some retrolisthesis and had tight stenosis exactly at the level of the disk. Extremely thick chunks of ligament were removed.  Lateral recess was decompressed both the right and left.  There was no disk herniation, but this was bulging some with upsweep osteophytes on each side.  No herniated fragment.  Penfield 4 and Penfield 2 were placed above and below the disk space.  X-ray was called again and fluoroscopic picture was taken confirming the level of decompression at 3-4 with still remaining top portion of the lamina of L3.  Dura was round.  The severe stenotic area at the level of the disk had been decompressed and there was fat at the cephalad portion of the decompression around the dura. Some veins were coagulated particularly on the left gutter, which were large, distended.  Some thrombin Gelfoam were placed, further cauterization and operative field was dry.  Standard layered closure with #1 Vicryl in the deep fascia, 2-0 Vicryl, and then subcuticular closure.  Dermabond on the skin, postop dressings and transferred to the recovery room  in stable condition.  Instrument count and needle counts were correct.     Legrand Lasser C. Ophelia CharterYates, M.D.     MCY/MEDQ  D:  11/05/2013  T:  11/06/2013  Job:  [id number]

## 2013-11-06 NOTE — Progress Notes (Signed)
Subjective: 1 Day Post-Op Procedure(s) (LRB): L3-4 Decompression (N/A) Patient reports pain as mild.  "My leg feels much better and does not hurt when I get up "  Objective: Vital signs in last 24 hours: Temp:  [98.1 F (36.7 C)-98.9 F (37.2 C)] 98.3 F (36.8 C) (10/20 0306) Pulse Rate:  [62-93] 78 (10/20 0306) Resp:  [14-26] 18 (10/20 0306) BP: (112-185)/(39-93) 112/39 mmHg (10/20 0306) SpO2:  [93 %-100 %] 97 % (10/20 0306) Weight:  [114.76 kg (253 lb)] 114.76 kg (253 lb) (10/19 1015)  Intake/Output from previous day: 10/19 0701 - 10/20 0700 In: 1000 [I.V.:1000] Out: 225 [Urine:225] Intake/Output this shift:    No results found for this basename: HGB,  in the last 72 hours No results found for this basename: WBC, RBC, HCT, PLT,  in the last 72 hours No results found for this basename: NA, K, CL, CO2, BUN, CREATININE, GLUCOSE, CALCIUM,  in the last 72 hours No results found for this basename: LABPT, INR,  in the last 72 hours  Neurologically intact  Assessment/Plan: 1 Day Post-Op Procedure(s) (LRB): L3-4 Decompression (N/A) Plan:  D/c Home office one week. Rx percocet on chart.   Amy Curtis C 11/06/2013, 7:35 AM

## 2013-11-06 NOTE — Progress Notes (Signed)
Pt and son given D/C instructions with Rx, verbal understanding was provided. [name] incision was clean and dry with no signs of infection, dressing was changed per MD order. [name] IV was removed prior to D/C. Pt D/C'd home via wheelchair @ 1015 per MD order. Pt is stable @ D/C and has no other needs at this time. Rema FendtAshley Carissa Musick, RN

## 2013-11-13 NOTE — Discharge Summary (Signed)
Physician Discharge Summary  Patient ID: Amy Curtis MRN: [id number] DOB/AGE: Aug 10, 1940 73 y.o.  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 Hypertension   . Sleep apnea     used CPAP for a while, has stopped CPAP one yr. ago , last test 3 yrs. or more ago  . Shortness of breath   . Hypothyroidism   . Diabetes mellitus without complication     losing weight & careful dietary choices, no Rx  . Glaucoma     R eye only but uses gtts in both eyes  . Depression   . Anxiety   . GERD (gastroesophageal reflux disease)   . H/O hiatal hernia   . Arthritis     DDD, lumbar, knees      Surgeries: Procedure(s): L3-4 Decompression on 11/05/2013   Consultants (if any):  none  Discharged Condition: Improved  Hospital Course: Amy Curtis is an 73 y.o. female who was admitted 11/05/2013 with a diagnosis of Spinal stenosis, lumbar region, with neurogenic claudication and went to the operating room on 11/05/2013 and underwent the above named procedures.    She was given perioperative antibiotics:  Anti-infectives   Start     Dose/Rate Route Frequency Ordered Stop   11/05/13 2000  ceFAZolin (ANCEF) IVPB 1 g/50 mL premix     1 g 100 mL/hr over 30 Minutes Intravenous Every 8 hours 11/05/13 1857 11/06/13 0449   11/05/13 1115  ceFAZolin (ANCEF) IVPB 2 g/50 mL premix     2 g 100 mL/hr over 30 Minutes Intravenous On call to O.R. 11/05/13 1040 11/05/13 1341   11/05/13 1012  ceFAZolin (ANCEF) 2-3 GM-% IVPB SOLR    Comments:  Shireen Quanodd, Robert   : cabinet override      11/05/13 1012 11/05/13 2214   11/04/13 0847  ceFAZolin (ANCEF) IVPB 2 g/50 mL premix  Status:  Discontinued     2 g 100 mL/hr over 30 Minutes  Intravenous On call to O.R. 11/04/13 [id number] 11/05/13 1857    .  She was given sequential compression devices, early ambulation for DVT prophylaxis.  She benefited maximally from the hospital stay and there were no complications.    Recent vital signs:  Filed Vitals:   11/06/13 0806  BP: 154/65  Pulse: 98  Temp: 98.8 F (37.1 C)  Resp: 18    Recent laboratory studies:  Lab Results  Component Value Date   HGB 14.0 10/31/2013   Lab Results  Component Value Date   WBC 9.9 10/31/2013   PLT 214 10/31/2013   Lab Results  Component Value Date   INR 0.99 10/31/2013   Lab Results  Component Value Date   NA 140 10/31/2013   K 3.8 10/31/2013   CL 101 10/31/2013   CO2 27 10/31/2013   BUN 24* 10/31/2013   CREATININE 0.66 10/31/2013   GLUCOSE 101* 10/31/2013    Discharge Medications:     Medication List         brimonidine 0.2 % ophthalmic solution  Commonly known as:  ALPHAGAN  Place 2 drops into both eyes 2 (two) times daily.     brinzolamide 1 % ophthalmic suspension  Commonly known as:  AZOPT  Place 1 drop into both eyes 2 (two) times daily.  chlorthalidone 25 MG tablet  Commonly known as:  HYGROTON  Take 25 mg by mouth daily with breakfast.     clonazePAM 0.5 MG tablet  Commonly known as:  KLONOPIN  Take 0.5 mg by mouth 2 (two) times daily as needed for anxiety.     HYDROcodone-acetaminophen 10-325 MG per tablet  Commonly known as:  NORCO  Take 1 tablet by mouth every 6 (six) hours as needed.     latanoprost 0.005 % ophthalmic solution  Commonly known as:  XALATAN  Place 1 drop into both eyes at bedtime.     levothyroxine 25 MCG tablet  Commonly known as:  SYNTHROID, LEVOTHROID  Take 25 mcg by mouth daily before breakfast.     lisinopril 20 MG tablet  Commonly known as:  PRINIVIL,ZESTRIL  Take 20 mg by mouth daily.     nabumetone 750 MG tablet  Commonly known as:  RELAFEN  Take 750 mg by mouth 2 (two) times daily as needed for moderate pain.      omeprazole 40 MG capsule  Commonly known as:  PRILOSEC  Take 40 mg by mouth daily.     oxyCODONE-acetaminophen 5-325 MG per tablet  Commonly known as:  ROXICET  Take 1-2 tablets by mouth every 4 (four) hours as needed.     venlafaxine 75 MG tablet  Commonly known as:  EFFEXOR  Take 75 mg by mouth 2 (two) times daily. 2 in the morning and 1 in the evening     verapamil 180 MG CR tablet  Commonly known as:  CALAN-SR  Take 180 mg by mouth 2 (two) times daily.        Diagnostic Studies: Dg Chest 2 View  10/31/2013   CLINICAL DATA:  Preoperative for back surgery. Hypertension. Diabetes. Sleep apnea.  EXAM: CHEST  2 VIEW  COMPARISON:  None  FINDINGS: Postoperative findings in the right shoulder.  Upper normal heart size. The lungs appear clear. No pleural effusion. Thoracolumbar spondylosis.  IMPRESSION: 1. No active cardiopulmonary disease is radiographically apparent. 2. Thoracolumbar spondylosis.   Electronically Signed   By: Herbie BaltimoreWalt  Liebkemann M.D.   On: 10/31/2013 15:52   Dg Lumbar Spine 2-3 Views  11/05/2013   CLINICAL DATA:  Localization films for L3-4 decompression  EXAM: DG C-ARM 61-120 MIN; LUMBAR SPINE - 2-3 VIEW  COMPARISON:  None.  FINDINGS: 11 seconds of fluoroscopy was utilized. Three spot films were obtained and reveal posterior retractors initially. The subsequent film shows clamps on the spinous processes at L3 and L4. The third film shows surgical instruments in the lumbar canal at L3-4.  IMPRESSION: Intraoperative localization at L3-4.   Electronically Signed   By: Alcide CleverMark  Lukens M.D.   On: 11/05/2013 15:07   Dg C-arm 1-60 Min  11/05/2013   CLINICAL DATA:  Localization films for L3-4 decompression  EXAM: DG C-ARM 61-120 MIN; LUMBAR SPINE - 2-3 VIEW  COMPARISON:  None.  FINDINGS: 11 seconds of fluoroscopy was utilized. Three spot films were obtained and reveal posterior retractors initially. The subsequent film shows clamps on the spinous processes at L3 and L4. The third  film shows surgical instruments in the lumbar canal at L3-4.  IMPRESSION: Intraoperative localization at L3-4.   Electronically Signed   By: Alcide CleverMark  Lukens M.D.   On: 11/05/2013 15:07    Disposition: 01-Home or Self Care  DISCHARGE INSTRUCTIONS:  No lifting greater than 10 lbs. Avoid bending, stooping and twisting. Walk in house for first week them may start to get  out slowly increasing distance up to one mile by 3 weeks post op. Keep incision dry for 3 days, may use tegaderm or similar water impervious dressing.      Follow-up Information   Schedule an appointment as soon as possible for a visit with Eldred Manges, MD.   Specialty:  Orthopedic Surgery   Contact information:   [address] [place] [address] [phone]        Signed: Wende Neighbors 11/13/2013, 10:50 AM

## 2015-03-10 ENCOUNTER — Other Ambulatory Visit: Payer: Self-pay | Admitting: Orthopaedic Surgery

## 2015-03-13 ENCOUNTER — Other Ambulatory Visit: Payer: Self-pay

## 2015-03-13 ENCOUNTER — Encounter (HOSPITAL_COMMUNITY): Payer: Self-pay

## 2015-03-13 ENCOUNTER — Encounter (HOSPITAL_COMMUNITY)
Admission: RE | Admit: 2015-03-13 | Discharge: 2015-03-13 | Disposition: A | Discharge status: Home / Self Care | Payer: Medicare PPO | Source: Ambulatory Visit | Attending: Orthopaedic Surgery | Admitting: Orthopaedic Surgery

## 2015-03-13 DIAGNOSIS — M179 Osteoarthritis of knee, unspecified: Secondary | ICD-10-CM

## 2015-03-13 DIAGNOSIS — Z01818 Encounter for other preprocedural examination: Secondary | ICD-10-CM | POA: Insufficient documentation

## 2015-03-13 DIAGNOSIS — Z79899 Other long term (current) drug therapy: Secondary | ICD-10-CM | POA: Insufficient documentation

## 2015-03-13 DIAGNOSIS — Z01812 Encounter for preprocedural laboratory examination: Secondary | ICD-10-CM | POA: Insufficient documentation

## 2015-03-13 DIAGNOSIS — H409 Unspecified glaucoma: Secondary | ICD-10-CM | POA: Insufficient documentation

## 2015-03-13 DIAGNOSIS — M1710 Unilateral primary osteoarthritis, unspecified knee: Secondary | ICD-10-CM

## 2015-03-13 DIAGNOSIS — E119 Type 2 diabetes mellitus without complications: Secondary | ICD-10-CM | POA: Diagnosis not present

## 2015-03-13 DIAGNOSIS — M1712 Unilateral primary osteoarthritis, left knee: Secondary | ICD-10-CM | POA: Diagnosis not present

## 2015-03-13 DIAGNOSIS — I10 Essential (primary) hypertension: Secondary | ICD-10-CM | POA: Insufficient documentation

## 2015-03-13 DIAGNOSIS — H9190 Unspecified hearing loss, unspecified ear: Secondary | ICD-10-CM | POA: Insufficient documentation

## 2015-03-13 DIAGNOSIS — E039 Hypothyroidism, unspecified: Secondary | ICD-10-CM | POA: Insufficient documentation

## 2015-03-13 DIAGNOSIS — G4733 Obstructive sleep apnea (adult) (pediatric): Secondary | ICD-10-CM | POA: Insufficient documentation

## 2015-03-13 DIAGNOSIS — K219 Gastro-esophageal reflux disease without esophagitis: Secondary | ICD-10-CM | POA: Diagnosis not present

## 2015-03-13 HISTORY — DX: Unspecified hearing loss, unspecified ear: H91.90

## 2015-03-13 LAB — CBC
HEMATOCRIT: 40.7 % (ref 36.0–46.0)
Hemoglobin: 13.5 g/dL (ref 12.0–15.0)
MCH: 29.5 pg (ref 26.0–34.0)
MCHC: 33.2 g/dL (ref 30.0–36.0)
MCV: 88.9 fL (ref 78.0–100.0)
Platelets: 246 10*3/uL (ref 150–400)
RBC: 4.58 MIL/uL (ref 3.87–5.11)
RDW: 13.3 % (ref 11.5–15.5)
WBC: 10.3 10*3/uL (ref 4.0–10.5)

## 2015-03-13 LAB — COMPREHENSIVE METABOLIC PANEL
ALT: 15 U/L (ref 14–54)
AST: 28 U/L (ref 15–41)
Albumin: 3.8 g/dL (ref 3.5–5.0)
Alkaline Phosphatase: 84 U/L (ref 38–126)
Anion gap: 11 (ref 5–15)
BILIRUBIN TOTAL: 0.8 mg/dL (ref 0.3–1.2)
BUN: 23 mg/dL — AB (ref 6–20)
CO2: 26 mmol/L (ref 22–32)
Calcium: 10.2 mg/dL (ref 8.9–10.3)
Chloride: 102 mmol/L (ref 101–111)
Creatinine, Ser: 0.85 mg/dL (ref 0.44–1.00)
GFR calc Af Amer: >60 mL/min (ref >60)
Glucose, Bld: 132 mg/dL — ABNORMAL HIGH (ref 65–99)
Potassium: 4.6 mmol/L (ref 3.5–5.1)
Sodium: 139 mmol/L (ref 135–145)
TOTAL PROTEIN: 6.9 g/dL (ref 6.5–8.1)

## 2015-03-13 LAB — SURGICAL PCR SCREEN
MRSA, PCR: NEGATIVE
STAPHYLOCOCCUS AUREUS: NEGATIVE

## 2015-03-13 LAB — PROTIME-INR
INR: 0.94 (ref 0.00–1.49)
PROTHROMBIN TIME: 12.8 s (ref 11.6–15.2)

## 2015-03-13 LAB — GLUCOSE, CAPILLARY: Glucose-Capillary: 126 mg/dL — ABNORMAL HIGH (ref 65–99)

## 2015-03-13 NOTE — Progress Notes (Signed)
Amy Curtis denies chest pain, has shortness of breath on;y when exerting self.  Amy Curtis does not check CBG's and has never been on medication for diabetes.   PCP is [place] Dillion, ? NP/PA at [facility] practice.  Records requested.  Patient does not see a cardiologist and does not recall ever having a Echo or stress test.  Patient has been tested for sleep apnea and is postive, but doers not wear a CPAP.

## 2015-03-13 NOTE — Pre-Procedure Instructions (Signed)
Amy Curtis  03/13/2015      [id number] - [place], [place] - [address] [place] [address] Phone: [phone] Fax: [phone]  [facility] [phone] - [place], [place] - [address] [place] [address] Phone: [phone] Fax: [phone]    Your procedure is scheduled on Wednesday, March 8th   Report to [facility] Admitting at 10:30 AM             (Posted surgery time 12:30 pm to 2:26 pm)   Call this number if you have problems the morning of surgery:  [phone]   Remember:  Do not eat food or drink liquids after midnight Tuesday.   Take these medicines the morning of surgery with A SIP OF WATER : Hydrocodone, Levothyroxine, Omeprazole, Effexor.              (4-5 days prior to surgery, STOP taking any herbal supplements, vitamins, anti-inflammatories)   Do not wear jewelry, make-up or nail polish.  Do not wear lotions, powders, or perfumes.     Do not shave 48 hours prior to surgery.    Do not bring valuables to the hospital.  [facility] is not responsible for any belongings or valuables.  Contacts, dentures or bridgework may not be worn into surgery.  Leave your suitcase in the car.  After surgery it may be brought to your room.  For patients admitted to the hospital, discharge time will be determined by your treatment team.  Name and phone number of your driver:     Please read over the following fact sheets that you were given. Pain Booklet, Coughing and Deep Breathing, MRSA Information and Surgical Site Infection Prevention

## 2015-03-13 NOTE — Pre-Procedure Instructions (Signed)
Amy Curtis  03/13/2015     Your procedure is scheduled on Wednesday, March 8th   Report to [facility] Admitting at 10:30 AM             (Posted surgery time 12:30 pm to 2:26 pm)   Call this number if you have problems the morning of surgery: [phone]   Remember:  Do not eat food or drink liquids after midnight Tuesday, March 7.   Take these medicines the morning of surgery with A SIP OF WATER : Levothyroxine, Omeprazole, Effexor, Verapamil .              (4-5 days prior to surgery, STOP taking any herbal supplements, vitamins, anti-inflammatories -nabumetone (RELAFEN). Stop PE-DM-APAP & PE-Doxyl-DM-APAP (ALKA-SELTZER PLUS DAY/NIGHT) .   Do not wear jewelry, make-up or nail polish.  Do not wear lotions, powders, or perfumes.     Do not shave 48 hours prior to surgery.    Do not bring valuables to the hospital.  [facility] is not responsible for any belongings or valuables.  Contacts, dentures or bridgework may not be worn into surgery.  Leave your suitcase in the car.  After surgery it may be brought to your room.  For patients admitted to the hospital, discharge time will be determined by your treatment team.  Name and phone number of your driver:     Please read over the following fact sheets that you were given. Pain Booklet, Coughing and Deep Breathing, MRSA Information and Surgical Site Infection Prevention

## 2015-03-13 NOTE — Progress Notes (Addendum)
How to Manage Your Diabetes Before Surgery   Why is it important to control my blood sugar before and after surgery?   Improving blood sugar levels before and after surgery helps healing and can limit problems.  A way of improving blood sugar control is eating a healthy diet by:  - Eating less sugar and carbohydrates  - Increasing activity/exercise  - Talk with your doctor about reaching your blood sugar goals  High blood sugars (greater than 180 mg/dL) can raise your risk of infections and slow down your recovery so you will need to focus on controlling your diabetes during the weeks before surgery.  Make sure that the doctor who takes care of your diabetes knows about your planned surgery including the date

## 2015-03-14 LAB — HEMOGLOBIN A1C
HEMOGLOBIN A1C: 6.9 % — AB (ref 4.8–5.6)
Mean Plasma Glucose: 151 mg/dL

## 2015-03-14 NOTE — Progress Notes (Signed)
Anesthesia Chart Review:  Pt is a 75 year old female scheduled for L total knee arthroplasty on 03/26/2015 with Dr. Ophelia Charter.   PCP is Berneda Rose, NP at [facility] who has cleared pt for surgery.   PMH includes:  HTN, DM (diet controlled), OSA (not using CPAP), hypothyroidism, glaucoma, GERD. Hard of hearing. Never smoker. BMI 45. S/p L3-4 decompression 11/05/13.   Medications include: brimonidine ophthalmic, chlorthalidone, levothyroxine, lisinopril, prilosec, verapamil.   Preoperative labs reviewed.  HgbA1c 6.9, glucose 132.   Chest x-ray 03/13/15 reviewed. No acute cardiopulmonary disease.   EKG 03/13/15: Sinus rhythm with PACs. Moderate voltage criteria for LVH, may be normal variant. Cannot rule out Septal infarct, age undetermined.   If no changes, I anticipate pt can proceed with surgery as scheduled.   Rica Mast, FNP-BC [facility]/Anesthesiology Phone: [phone] 03/14/2015 11:18 AM

## 2015-03-25 MED ORDER — CEFAZOLIN SODIUM-DEXTROSE 2-3 GM-% IV SOLR
2.0000 g | INTRAVENOUS | Status: AC
  Administered 2015-03-26: 2 g via INTRAVENOUS
  Filled 2015-03-25: qty 50

## 2015-03-25 MED ORDER — CHLORHEXIDINE GLUCONATE 4 % EX LIQD
60.0000 mL | Freq: Once | CUTANEOUS | Status: DC
Start: 2015-03-26 — End: 2015-03-26

## 2015-03-25 NOTE — H&P (Signed)
TOTAL KNEE ADMISSION H&P  Patient is being admitted for left total knee arthroplasty.  Subjective:  Chief Complaint:left knee pain.  HPI: Amy Curtis, 75 y.o. female, has a history of pain and functional disability in the left knee due to arthritis and has failed non-surgical conservative treatments for greater than 12 weeks to includeNSAID's and/or analgesics, corticosteriod injections, use of assistive devices and activity modification.  Onset of symptoms was gradual, starting 10 years ago with gradually worsening course since that time. The patient noted no past surgery on the left knee(s).  Patient currently rates pain in the left knee(s) at 10 out of 10 with activity. Patient has night pain, pain that interferes with activities of daily living, crepitus and joint swelling.  Patient has evidence of subchondral sclerosis and periarticular osteophytes by imaging studies. . There is no active infection.  Patient Active Problem List   Diagnosis Date Noted  . Spinal stenosis, lumbar region, with neurogenic claudication 11/05/2013  . Lumbar spinal stenosis 11/05/2013   Past Medical History  Diagnosis Date  . Hypertension   . Sleep apnea     used CPAP for a while, has stopped CPAP one yr. ago , last test 3 yrs. or more ago  . Shortness of breath   . Hypothyroidism   . Glaucoma     R eye only but uses gtts in both eyes  . Depression   . Anxiety   . GERD (gastroesophageal reflux disease)   . H/O hiatal hernia   . Arthritis     DDD, lumbar, knees    . Diabetes mellitus without complication (HCC)     losing weight & careful dietary choices, no Rx  . HOH (hard of hearing)     Past Surgical History  Procedure Laterality Date  . Shoulder arthroscopy Right     RCR  . Tonsillectomy    . Dilation and curettage of uterus      x2  . [facility]  [facility]  . Tubal ligation    . Carpal tunnel release Right   . Lumbar laminectomy N/A 11/05/2013    Procedure: L3-4  Decompression;  Surgeon: Eldred MangesMark C Yates, MD;  [facility];  Service: Orthopedics;  Laterality: N/A;    No prescriptions prior to admission   Allergies  Allergen Reactions  . Adhesive [Tape] Itching and Rash    HOSPITAL TAPE, Rash did not heal well    Social History  Substance Use Topics  . Smoking status: Never Smoker   . Smokeless tobacco: Not on file  . Alcohol Use: No    No family history on file.   Review of Systems  Constitutional: Negative.   HENT: Negative.   Eyes: Negative.   Respiratory: Negative.   Cardiovascular: Negative.   Gastrointestinal: Negative.   Genitourinary: Negative.   Musculoskeletal: Positive for joint pain.  Skin: Negative.   Neurological: Negative.   Psychiatric/Behavioral: Negative.     Objective:  Physical Exam  Constitutional: She is oriented to person, place, and time. No distress.  HENT:  Head: Atraumatic.  Eyes: EOM are normal.  Neck: Normal range of motion.  Cardiovascular: Normal rate.   Respiratory: Effort normal. No respiratory distress.  GI: She exhibits no distension.  Musculoskeletal: She exhibits edema and tenderness.  Neurological: She is alert and oriented to person, place, and time.  Skin: Skin is warm and dry.  Psychiatric: She has a normal mood and affect.    Vital signs in  last 24 hours:    Labs:   Estimated body mass index is 43.49 kg/(m^2) as calculated from the following:   Height as of 11/05/13:  (1.626 m).   Weight as of 10/31/13: 114.987 kg (253 lb 8 oz).   Imaging Review Plain radiographs demonstrate moderate degenerative joint disease of the left knee(s). The overall alignment ismild varus. The bone quality appears to be good for age and reported activity level.  Assessment/Plan:  End stage arthritis, left knee   The patient history, physical examination, clinical judgment of the provider and imaging studies are consistent with end stage degenerative joint disease of the left knee(s) and  total knee arthroplasty is deemed medically necessary. The treatment options including medical management, injection therapy arthroscopy and arthroplasty were discussed at length. The risks and benefits of total knee arthroplasty were presented and reviewed. The risks due to aseptic loosening, infection, stiffness, patella tracking problems, thromboembolic complications and other imponderables were discussed. The patient acknowledged the explanation, agreed to proceed with the plan and consent was signed. Patient is being admitted for inpatient treatment for surgery, pain control, PT, OT, prophylactic antibiotics, VTE prophylaxis, progressive ambulation and ADL's and discharge planning. The patient is planning to be discharged home with home health services

## 2015-03-26 ENCOUNTER — Inpatient Hospital Stay (HOSPITAL_COMMUNITY): Payer: Medicare PPO | Admitting: Emergency Medicine

## 2015-03-26 ENCOUNTER — Inpatient Hospital Stay (HOSPITAL_COMMUNITY): Payer: Medicare PPO

## 2015-03-26 ENCOUNTER — Inpatient Hospital Stay (HOSPITAL_COMMUNITY)
Admission: RE | Admit: 2015-03-26 | Discharge: 2015-03-28 | DRG: 470 | Disposition: A | Discharge status: Home Health | Payer: Medicare PPO | Source: Ambulatory Visit | Attending: Orthopaedic Surgery | Admitting: Orthopaedic Surgery

## 2015-03-26 ENCOUNTER — Encounter (HOSPITAL_COMMUNITY)
Admission: RE | Disposition: A | Discharge status: Home Health | Payer: Self-pay | Source: Ambulatory Visit | Attending: Orthopaedic Surgery

## 2015-03-26 ENCOUNTER — Inpatient Hospital Stay (HOSPITAL_COMMUNITY): Payer: Medicare PPO | Admitting: Anesthesiology

## 2015-03-26 DIAGNOSIS — M1712 Unilateral primary osteoarthritis, left knee: Principal | ICD-10-CM | POA: Diagnosis present

## 2015-03-26 DIAGNOSIS — H9190 Unspecified hearing loss, unspecified ear: Secondary | ICD-10-CM | POA: Diagnosis present

## 2015-03-26 DIAGNOSIS — F329 Major depressive disorder, single episode, unspecified: Secondary | ICD-10-CM | POA: Diagnosis not present

## 2015-03-26 DIAGNOSIS — Z888 Allergy status to other drugs, medicaments and biological substances status: Secondary | ICD-10-CM | POA: Diagnosis not present

## 2015-03-26 DIAGNOSIS — I10 Essential (primary) hypertension: Secondary | ICD-10-CM | POA: Diagnosis not present

## 2015-03-26 DIAGNOSIS — G4730 Sleep apnea, unspecified: Secondary | ICD-10-CM | POA: Diagnosis present

## 2015-03-26 DIAGNOSIS — Z09 Encounter for follow-up examination after completed treatment for conditions other than malignant neoplasm: Secondary | ICD-10-CM

## 2015-03-26 DIAGNOSIS — F419 Anxiety disorder, unspecified: Secondary | ICD-10-CM | POA: Diagnosis present

## 2015-03-26 DIAGNOSIS — E039 Hypothyroidism, unspecified: Secondary | ICD-10-CM | POA: Diagnosis present

## 2015-03-26 DIAGNOSIS — K219 Gastro-esophageal reflux disease without esophagitis: Secondary | ICD-10-CM | POA: Diagnosis present

## 2015-03-26 DIAGNOSIS — H409 Unspecified glaucoma: Secondary | ICD-10-CM | POA: Diagnosis present

## 2015-03-26 DIAGNOSIS — E119 Type 2 diabetes mellitus without complications: Secondary | ICD-10-CM | POA: Diagnosis not present

## 2015-03-26 DIAGNOSIS — Z96652 Presence of left artificial knee joint: Secondary | ICD-10-CM

## 2015-03-26 DIAGNOSIS — M4806 Spinal stenosis, lumbar region: Secondary | ICD-10-CM | POA: Diagnosis present

## 2015-03-26 DIAGNOSIS — M5136 Other intervertebral disc degeneration, lumbar region: Secondary | ICD-10-CM | POA: Diagnosis present

## 2015-03-26 HISTORY — PX: TOTAL KNEE ARTHROPLASTY: SHX125

## 2015-03-26 HISTORY — DX: Migraine, unspecified, not intractable, without status migrainosus: G43.909

## 2015-03-26 HISTORY — DX: Type 2 diabetes mellitus without complications: E11.9

## 2015-03-26 LAB — GLUCOSE, CAPILLARY
Glucose-Capillary: 128 mg/dL — ABNORMAL HIGH (ref 65–99)
Glucose-Capillary: 154 mg/dL — ABNORMAL HIGH (ref 65–99)
Glucose-Capillary: 191 mg/dL — ABNORMAL HIGH (ref 65–99)

## 2015-03-26 SURGERY — ARTHROPLASTY, KNEE, TOTAL
Anesthesia: Regional | Site: Knee | Laterality: Left

## 2015-03-26 MED ORDER — SODIUM CHLORIDE 0.9 % IR SOLN
Status: DC | PRN
  Administered 2015-03-26: 1000 mL

## 2015-03-26 MED ORDER — ONDANSETRON HCL 4 MG/2ML IJ SOLN
4.0000 mg | Freq: Four times a day (QID) | INTRAMUSCULAR | Status: DC | PRN
  Administered 2015-03-27 (×2): 4 mg via INTRAVENOUS
  Filled 2015-03-26 (×2): qty 2

## 2015-03-26 MED ORDER — MIDAZOLAM HCL 2 MG/2ML IJ SOLN
INTRAMUSCULAR | Status: DC | PRN
  Administered 2015-03-26 (×2): 1 mg via INTRAVENOUS

## 2015-03-26 MED ORDER — BRIMONIDINE TARTRATE 0.2 % OP SOLN
2.0000 [drp] | Freq: Two times a day (BID) | OPHTHALMIC | Status: DC
  Administered 2015-03-26 – 2015-03-28 (×4): 2 [drp] via OPHTHALMIC
  Filled 2015-03-26: qty 5

## 2015-03-26 MED ORDER — HYDROMORPHONE HCL 1 MG/ML IJ SOLN
INTRAMUSCULAR | Status: AC
  Administered 2015-03-26: 0.5 mg via INTRAVENOUS
  Filled 2015-03-26: qty 1

## 2015-03-26 MED ORDER — HYDROMORPHONE HCL 1 MG/ML IJ SOLN
0.2500 mg | INTRAMUSCULAR | Status: DC | PRN
  Administered 2015-03-26 (×2): 0.5 mg via INTRAVENOUS

## 2015-03-26 MED ORDER — LATANOPROST 0.005 % OP SOLN
1.0000 [drp] | Freq: Every day | OPHTHALMIC | Status: DC
  Administered 2015-03-26 – 2015-03-27 (×2): 1 [drp] via OPHTHALMIC
  Filled 2015-03-26: qty 2.5

## 2015-03-26 MED ORDER — MIDAZOLAM HCL 2 MG/2ML IJ SOLN
INTRAMUSCULAR | Status: AC
  Administered 2015-03-26: 2 mg
  Filled 2015-03-26: qty 2

## 2015-03-26 MED ORDER — GLYCOPYRROLATE 0.2 MG/ML IJ SOLN
INTRAMUSCULAR | Status: AC
  Filled 2015-03-26: qty 1

## 2015-03-26 MED ORDER — FENTANYL CITRATE (PF) 250 MCG/5ML IJ SOLN
INTRAMUSCULAR | Status: AC
  Filled 2015-03-26: qty 5

## 2015-03-26 MED ORDER — VERAPAMIL HCL ER 180 MG PO TBCR
180.0000 mg | EXTENDED_RELEASE_TABLET | Freq: Two times a day (BID) | ORAL | Status: DC
  Administered 2015-03-26 – 2015-03-28 (×4): 180 mg via ORAL
  Filled 2015-03-26 (×7): qty 1

## 2015-03-26 MED ORDER — ONDANSETRON HCL 4 MG PO TABS: 4.0000 mg | ORAL_TABLET | Freq: Four times a day (QID) | ORAL | Status: DC | PRN

## 2015-03-26 MED ORDER — ONDANSETRON HCL 4 MG/2ML IJ SOLN
INTRAMUSCULAR | Status: AC
  Filled 2015-03-26: qty 2

## 2015-03-26 MED ORDER — POLYETHYLENE GLYCOL 3350 17 G PO PACK: 17.0000 g | PACK | Freq: Every day | ORAL | Status: DC | PRN

## 2015-03-26 MED ORDER — ONDANSETRON HCL 4 MG/2ML IJ SOLN
INTRAMUSCULAR | Status: DC | PRN
  Administered 2015-03-26: 4 mg via INTRAVENOUS

## 2015-03-26 MED ORDER — MIDAZOLAM HCL 2 MG/2ML IJ SOLN
INTRAMUSCULAR | Status: AC
  Filled 2015-03-26: qty 2

## 2015-03-26 MED ORDER — ACETAMINOPHEN 325 MG PO TABS
650.0000 mg | ORAL_TABLET | Freq: Four times a day (QID) | ORAL | Status: DC | PRN
  Administered 2015-03-27: 650 mg via ORAL
  Filled 2015-03-26: qty 2

## 2015-03-26 MED ORDER — PANTOPRAZOLE SODIUM 40 MG PO TBEC
40.0000 mg | DELAYED_RELEASE_TABLET | Freq: Every day | ORAL | Status: DC
  Administered 2015-03-27 – 2015-03-28 (×2): 40 mg via ORAL
  Filled 2015-03-26 (×2): qty 1

## 2015-03-26 MED ORDER — MENTHOL 3 MG MT LOZG: 1.0000 | LOZENGE | OROMUCOSAL | Status: DC | PRN

## 2015-03-26 MED ORDER — DORZOLAMIDE HCL 2 % OP SOLN
1.0000 [drp] | Freq: Two times a day (BID) | OPHTHALMIC | Status: DC
  Administered 2015-03-26 – 2015-03-28 (×4): 1 [drp] via OPHTHALMIC
  Filled 2015-03-26: qty 10

## 2015-03-26 MED ORDER — BUPIVACAINE HCL (PF) 0.25 % IJ SOLN
INTRAMUSCULAR | Status: AC
  Filled 2015-03-26: qty 30

## 2015-03-26 MED ORDER — BUPIVACAINE-EPINEPHRINE (PF) 0.5% -1:200000 IJ SOLN
INTRAMUSCULAR | Status: DC | PRN
  Administered 2015-03-26: 30 mL via PERINEURAL

## 2015-03-26 MED ORDER — METHOCARBAMOL 500 MG PO TABS
500.0000 mg | ORAL_TABLET | Freq: Four times a day (QID) | ORAL | Status: DC | PRN
  Administered 2015-03-26 – 2015-03-28 (×4): 500 mg via ORAL
  Filled 2015-03-26 (×3): qty 1

## 2015-03-26 MED ORDER — LISINOPRIL 20 MG PO TABS
20.0000 mg | ORAL_TABLET | Freq: Every day | ORAL | Status: DC
  Administered 2015-03-26 – 2015-03-28 (×3): 20 mg via ORAL
  Filled 2015-03-26 (×3): qty 1

## 2015-03-26 MED ORDER — METHOCARBAMOL 1000 MG/10ML IJ SOLN
500.0000 mg | Freq: Four times a day (QID) | INTRAVENOUS | Status: DC | PRN
  Filled 2015-03-26: qty 5

## 2015-03-26 MED ORDER — BRINZOLAMIDE 1 % OP SUSP
1.0000 [drp] | Freq: Two times a day (BID) | OPHTHALMIC | Status: DC
  Filled 2015-03-26: qty 10

## 2015-03-26 MED ORDER — METHOCARBAMOL 500 MG PO TABS
ORAL_TABLET | ORAL | Status: AC
  Administered 2015-03-26: 500 mg via ORAL
  Filled 2015-03-26: qty 1

## 2015-03-26 MED ORDER — LACTATED RINGERS IV SOLN
INTRAVENOUS | Status: DC
  Administered 2015-03-26: 50 mL/h via INTRAVENOUS
  Administered 2015-03-26: 13:00:00 via INTRAVENOUS

## 2015-03-26 MED ORDER — OXYCODONE HCL 5 MG PO TABS
5.0000 mg | ORAL_TABLET | ORAL | Status: DC | PRN
  Administered 2015-03-26 – 2015-03-28 (×7): 5 mg via ORAL
  Filled 2015-03-26 (×7): qty 1

## 2015-03-26 MED ORDER — METOPROLOL TARTRATE 1 MG/ML IV SOLN
INTRAVENOUS | Status: AC
  Filled 2015-03-26: qty 5

## 2015-03-26 MED ORDER — EPHEDRINE SULFATE 50 MG/ML IJ SOLN
INTRAMUSCULAR | Status: DC | PRN
  Administered 2015-03-26: 15 mg via INTRAVENOUS

## 2015-03-26 MED ORDER — PRIMIDONE 50 MG PO TABS
50.0000 mg | ORAL_TABLET | Freq: Every day | ORAL | Status: DC
  Administered 2015-03-26 – 2015-03-27 (×2): 50 mg via ORAL
  Filled 2015-03-26 (×3): qty 1

## 2015-03-26 MED ORDER — VENLAFAXINE HCL 75 MG PO TABS
75.0000 mg | ORAL_TABLET | Freq: Every day | ORAL | Status: DC
  Administered 2015-03-26 – 2015-03-27 (×2): 75 mg via ORAL
  Filled 2015-03-26 (×3): qty 1

## 2015-03-26 MED ORDER — FENTANYL CITRATE (PF) 100 MCG/2ML IJ SOLN
INTRAMUSCULAR | Status: DC | PRN
  Administered 2015-03-26: 25 ug via INTRAVENOUS
  Administered 2015-03-26: 50 ug via INTRAVENOUS
  Administered 2015-03-26: 100 ug via INTRAVENOUS
  Administered 2015-03-26 (×3): 50 ug via INTRAVENOUS
  Administered 2015-03-26: 25 ug via INTRAVENOUS

## 2015-03-26 MED ORDER — ACETAMINOPHEN 650 MG RE SUPP: 650.0000 mg | Freq: Four times a day (QID) | RECTAL | Status: DC | PRN

## 2015-03-26 MED ORDER — METOCLOPRAMIDE HCL 5 MG PO TABS
5.0000 mg | ORAL_TABLET | Freq: Three times a day (TID) | ORAL | Status: DC | PRN
  Administered 2015-03-27: 10 mg via ORAL
  Filled 2015-03-26: qty 2

## 2015-03-26 MED ORDER — CHLORTHALIDONE 25 MG PO TABS
25.0000 mg | ORAL_TABLET | Freq: Every day | ORAL | Status: DC
Start: 2015-03-27 — End: 2015-03-28
  Administered 2015-03-27 – 2015-03-28 (×2): 25 mg via ORAL
  Filled 2015-03-26 (×2): qty 1

## 2015-03-26 MED ORDER — EPHEDRINE SULFATE 50 MG/ML IJ SOLN
INTRAMUSCULAR | Status: AC
  Filled 2015-03-26: qty 1

## 2015-03-26 MED ORDER — SODIUM CHLORIDE 0.9 % IR SOLN
Status: DC | PRN
  Administered 2015-03-26: 3000 mL

## 2015-03-26 MED ORDER — PHENYLEPHRINE 40 MCG/ML (10ML) SYRINGE FOR IV PUSH (FOR BLOOD PRESSURE SUPPORT)
PREFILLED_SYRINGE | INTRAVENOUS | Status: AC
  Filled 2015-03-26: qty 10

## 2015-03-26 MED ORDER — MEPERIDINE HCL 25 MG/ML IJ SOLN: 6.2500 mg | INTRAMUSCULAR | Status: DC | PRN

## 2015-03-26 MED ORDER — FENTANYL CITRATE (PF) 100 MCG/2ML IJ SOLN
INTRAMUSCULAR | Status: AC
  Administered 2015-03-26: 50 ug
  Filled 2015-03-26: qty 2

## 2015-03-26 MED ORDER — LIDOCAINE HCL (CARDIAC) 20 MG/ML IV SOLN
INTRAVENOUS | Status: DC | PRN
  Administered 2015-03-26: 100 mg via INTRATRACHEAL

## 2015-03-26 MED ORDER — ASPIRIN EC 325 MG PO TBEC
325.0000 mg | DELAYED_RELEASE_TABLET | Freq: Every day | ORAL | Status: DC
  Administered 2015-03-27 – 2015-03-28 (×2): 325 mg via ORAL
  Filled 2015-03-26 (×2): qty 1

## 2015-03-26 MED ORDER — SODIUM CHLORIDE 0.45 % IV SOLN
INTRAVENOUS | Status: DC
  Administered 2015-03-26: 17:00:00 via INTRAVENOUS

## 2015-03-26 MED ORDER — DOCUSATE SODIUM 100 MG PO CAPS
100.0000 mg | ORAL_CAPSULE | Freq: Two times a day (BID) | ORAL | Status: DC
  Administered 2015-03-26 – 2015-03-28 (×4): 100 mg via ORAL
  Filled 2015-03-26 (×4): qty 1

## 2015-03-26 MED ORDER — CEFAZOLIN SODIUM 1-5 GM-% IV SOLN
1.0000 g | Freq: Three times a day (TID) | INTRAVENOUS | Status: AC
  Administered 2015-03-26 – 2015-03-27 (×2): 1 g via INTRAVENOUS
  Filled 2015-03-26 (×2): qty 50

## 2015-03-26 MED ORDER — ARTIFICIAL TEARS OP OINT
TOPICAL_OINTMENT | OPHTHALMIC | Status: AC
  Filled 2015-03-26: qty 3.5

## 2015-03-26 MED ORDER — PHENYLEPHRINE HCL 10 MG/ML IJ SOLN
INTRAMUSCULAR | Status: DC | PRN
  Administered 2015-03-26 (×2): 120 ug via INTRAVENOUS
  Administered 2015-03-26: 160 ug via INTRAVENOUS

## 2015-03-26 MED ORDER — GLYCOPYRROLATE 0.2 MG/ML IJ SOLN
INTRAMUSCULAR | Status: DC | PRN
  Administered 2015-03-26: 0.2 mg via INTRAVENOUS

## 2015-03-26 MED ORDER — SUCCINYLCHOLINE CHLORIDE 20 MG/ML IJ SOLN
INTRAMUSCULAR | Status: DC | PRN
  Administered 2015-03-26: 120 mg via INTRAVENOUS

## 2015-03-26 MED ORDER — METOCLOPRAMIDE HCL 5 MG/ML IJ SOLN
5.0000 mg | Freq: Three times a day (TID) | INTRAMUSCULAR | Status: DC | PRN
  Administered 2015-03-27: 10 mg via INTRAVENOUS
  Filled 2015-03-26: qty 2

## 2015-03-26 MED ORDER — PROPOFOL 500 MG/50ML IV EMUL
INTRAVENOUS | Status: DC | PRN
  Administered 2015-03-26: 10 ug/kg/min via INTRAVENOUS

## 2015-03-26 MED ORDER — VENLAFAXINE HCL 75 MG PO TABS
150.0000 mg | ORAL_TABLET | Freq: Every day | ORAL | Status: DC
  Administered 2015-03-27 – 2015-03-28 (×2): 150 mg via ORAL
  Filled 2015-03-26 (×3): qty 2

## 2015-03-26 MED ORDER — VENLAFAXINE HCL 75 MG PO TABS: 75.0000 mg | ORAL_TABLET | Freq: Two times a day (BID) | ORAL | Status: DC

## 2015-03-26 MED ORDER — PROPOFOL 10 MG/ML IV BOLUS
INTRAVENOUS | Status: DC | PRN
  Administered 2015-03-26: 200 mg via INTRAVENOUS

## 2015-03-26 MED ORDER — PHENOL 1.4 % MT LIQD: 1.0000 | OROMUCOSAL | Status: DC | PRN

## 2015-03-26 MED ORDER — LEVOTHYROXINE SODIUM 25 MCG PO TABS
25.0000 ug | ORAL_TABLET | Freq: Every day | ORAL | Status: DC
  Administered 2015-03-27 – 2015-03-28 (×2): 25 ug via ORAL
  Filled 2015-03-26 (×2): qty 1

## 2015-03-26 MED ORDER — PHENYLEPHRINE HCL 10 MG/ML IJ SOLN
10.0000 mg | INTRAVENOUS | Status: DC | PRN
  Administered 2015-03-26: 25 ug/min via INTRAVENOUS

## 2015-03-26 MED ORDER — HYDROMORPHONE HCL 1 MG/ML IJ SOLN
0.5000 mg | INTRAMUSCULAR | Status: DC | PRN
  Administered 2015-03-27: 0.5 mg via INTRAVENOUS
  Filled 2015-03-26: qty 1

## 2015-03-26 MED ORDER — LIDOCAINE HCL (CARDIAC) 20 MG/ML IV SOLN
INTRAVENOUS | Status: AC
  Filled 2015-03-26: qty 5

## 2015-03-26 MED ORDER — ARTIFICIAL TEARS OP OINT
TOPICAL_OINTMENT | OPHTHALMIC | Status: DC | PRN
  Administered 2015-03-26: 1 via OPHTHALMIC

## 2015-03-26 MED ORDER — PROMETHAZINE HCL 25 MG/ML IJ SOLN: 6.2500 mg | INTRAMUSCULAR | Status: DC | PRN

## 2015-03-26 SURGICAL SUPPLY — 72 items
BANDAGE ACE 4X5 VEL STRL LF (GAUZE/BANDAGES/DRESSINGS) ×3 IMPLANT
BANDAGE ACE 6X5 VEL STRL LF (GAUZE/BANDAGES/DRESSINGS) ×3 IMPLANT
BANDAGE ESMARK 6X9 LF (GAUZE/BANDAGES/DRESSINGS) ×1 IMPLANT
BLADE SAGITTAL 25.0X1.19X90 (BLADE) ×2 IMPLANT
BLADE SAGITTAL 25.0X1.19X90MM (BLADE) ×1
BLADE SAW SGTL 13X75X1.27 (BLADE) ×3 IMPLANT
BNDG ESMARK 6X9 LF (GAUZE/BANDAGES/DRESSINGS) ×3
BOWL SMART MIX CTS (DISPOSABLE) ×3 IMPLANT
CAP KNEE TOTAL 3 SIGMA ×3 IMPLANT
CEMENT HV SMART SET (Cement) ×6 IMPLANT
CLOSURE WOUND 1/2 X4 (GAUZE/BANDAGES/DRESSINGS) ×1
COVER SURGICAL LIGHT HANDLE (MISCELLANEOUS) ×3 IMPLANT
CUFF TOURNIQUET SINGLE 44IN (TOURNIQUET CUFF) ×3 IMPLANT
DRAPE ORTHO SPLIT 77X108 STRL (DRAPES) ×4
DRAPE PROXIMA HALF (DRAPES) ×3 IMPLANT
DRAPE SURG ORHT 6 SPLT 77X108 (DRAPES) ×2 IMPLANT
DRAPE U-SHAPE 47X51 STRL (DRAPES) ×3 IMPLANT
DRSG PAD ABDOMINAL 8X10 ST (GAUZE/BANDAGES/DRESSINGS) ×6 IMPLANT
DURAPREP 26ML APPLICATOR (WOUND CARE) ×3 IMPLANT
ELECT REM PT RETURN 9FT ADLT (ELECTROSURGICAL) ×3
ELECTRODE REM PT RTRN 9FT ADLT (ELECTROSURGICAL) ×1 IMPLANT
EVACUATOR 1/8 PVC DRAIN (DRAIN) IMPLANT
FACESHIELD WRAPAROUND (MASK) ×3 IMPLANT
GAUZE SPONGE 4X4 12PLY STRL (GAUZE/BANDAGES/DRESSINGS) ×6 IMPLANT
GAUZE XEROFORM 5X9 LF (GAUZE/BANDAGES/DRESSINGS) ×3 IMPLANT
GLOVE BIO SURGEON STRL SZ7 (GLOVE) ×3 IMPLANT
GLOVE BIOGEL PI IND STRL 7.0 (GLOVE) ×1 IMPLANT
GLOVE BIOGEL PI IND STRL 8 (GLOVE) ×2 IMPLANT
GLOVE BIOGEL PI INDICATOR 7.0 (GLOVE) ×2
GLOVE BIOGEL PI INDICATOR 8 (GLOVE) ×4
GLOVE ORTHO TXT STRL SZ7.5 (GLOVE) ×9 IMPLANT
GOWN STRL REUS W/ TWL LRG LVL3 (GOWN DISPOSABLE) ×1 IMPLANT
GOWN STRL REUS W/ TWL XL LVL3 (GOWN DISPOSABLE) ×1 IMPLANT
GOWN STRL REUS W/TWL 2XL LVL3 (GOWN DISPOSABLE) ×3 IMPLANT
GOWN STRL REUS W/TWL LRG LVL3 (GOWN DISPOSABLE) ×2
GOWN STRL REUS W/TWL XL LVL3 (GOWN DISPOSABLE) ×2
HANDPIECE INTERPULSE COAX TIP (DISPOSABLE) ×2
IMMOBILIZER KNEE 22 UNIV (SOFTGOODS) ×3 IMPLANT
KIT BASIN OR (CUSTOM PROCEDURE TRAY) ×3 IMPLANT
KIT ROOM TURNOVER OR (KITS) ×3 IMPLANT
MANIFOLD NEPTUNE II (INSTRUMENTS) ×3 IMPLANT
MARKER SKIN DUAL TIP RULER LAB (MISCELLANEOUS) ×3 IMPLANT
NEEDLE HYPO 25GX1X1/2 BEV (NEEDLE) ×3 IMPLANT
NS IRRIG 1000ML POUR BTL (IV SOLUTION) ×3 IMPLANT
PACK TOTAL JOINT (CUSTOM PROCEDURE TRAY) ×3 IMPLANT
PACK UNIVERSAL I (CUSTOM PROCEDURE TRAY) ×3 IMPLANT
PAD ARMBOARD 7.5X6 YLW CONV (MISCELLANEOUS) ×6 IMPLANT
PAD CAST 4YDX4 CTTN HI CHSV (CAST SUPPLIES) ×1 IMPLANT
PADDING CAST ABS 4INX4YD NS (CAST SUPPLIES) ×2
PADDING CAST ABS 6INX4YD NS (CAST SUPPLIES) ×2
PADDING CAST ABS COTTON 4X4 ST (CAST SUPPLIES) ×1 IMPLANT
PADDING CAST ABS COTTON 6X4 NS (CAST SUPPLIES) ×1 IMPLANT
PADDING CAST COTTON 4X4 STRL (CAST SUPPLIES) ×2
PADDING CAST COTTON 6X4 STRL (CAST SUPPLIES) ×3 IMPLANT
SET HNDPC FAN SPRY TIP SCT (DISPOSABLE) ×1 IMPLANT
STAPLER VISISTAT 35W (STAPLE) ×3 IMPLANT
STRIP CLOSURE SKIN 1/2X4 (GAUZE/BANDAGES/DRESSINGS) ×2 IMPLANT
SUCTION FRAZIER HANDLE 10FR (MISCELLANEOUS) ×2
SUCTION TUBE FRAZIER 10FR DISP (MISCELLANEOUS) ×1 IMPLANT
SUT VIC AB 0 CT1 27 (SUTURE) ×4
SUT VIC AB 0 CT1 27XBRD ANBCTR (SUTURE) ×2 IMPLANT
SUT VIC AB 1 CTX 18 (SUTURE) ×3 IMPLANT
SUT VIC AB 1 CTX 36 (SUTURE) ×4
SUT VIC AB 1 CTX36XBRD ANBCTR (SUTURE) ×2 IMPLANT
SUT VIC AB 2-0 CT1 27 (SUTURE) ×4
SUT VIC AB 2-0 CT1 TAPERPNT 27 (SUTURE) ×2 IMPLANT
SUT VIC AB 3-0 X1 27 (SUTURE) ×3 IMPLANT
SYR CONTROL 10ML LL (SYRINGE) ×3 IMPLANT
TOWEL OR 17X24 6PK STRL BLUE (TOWEL DISPOSABLE) ×3 IMPLANT
TOWEL OR 17X26 10 PK STRL BLUE (TOWEL DISPOSABLE) ×3 IMPLANT
TRAY FOLEY CATH 16FRSI W/METER (SET/KITS/TRAYS/PACK) ×3 IMPLANT
WATER STERILE IRR 1000ML POUR (IV SOLUTION) ×3 IMPLANT

## 2015-03-26 NOTE — Progress Notes (Signed)
Orthopedic Tech Progress Note Patient Details:  Amy Curtis [birth year] [id number]  CPM Left Knee CPM Left Knee: On Left Knee Flexion (Degrees): 90 Left Knee Extension (Degrees): 0 Additional Comments: trapeze bar patient helper Viewed order from doctor's order list  Nikki DomCrawford, Shana Younge 03/26/2015, 3:27 PM

## 2015-03-26 NOTE — Interval H&P Note (Signed)
History and Physical Interval Note:  03/26/2015 12:11 PM  Amy Curtis  has presented today for surgery, with the diagnosis of Left Knee Osteoarthritis  The various methods of treatment have been discussed with the patient and family. After consideration of risks, benefits and other options for treatment, the patient has consented to  Procedure(s): TOTAL KNEE ARTHROPLASTY (Left) as a surgical intervention .  The patient's history has been reviewed, patient examined, no change in status, stable for surgery.  I have reviewed the patient's chart and labs.  Questions were answered to the patient's satisfaction.     Amy Curtis

## 2015-03-26 NOTE — Anesthesia Postprocedure Evaluation (Signed)
Anesthesia Post Note  Patient: Amy Curtis  Procedure(s) Performed: Procedure(s) (LRB): TOTAL KNEE ARTHROPLASTY (Left)  Patient location during evaluation: PACU Anesthesia Type: General Level of consciousness: awake and alert Pain management: pain level controlled Vital Signs Assessment: post-procedure vital signs reviewed and stable Respiratory status: spontaneous breathing, nonlabored ventilation, respiratory function stable and patient connected to nasal cannula oxygen Cardiovascular status: blood pressure returned to baseline and stable Postop Assessment: no signs of nausea or vomiting Anesthetic complications: no    Last Vitals:  Filed Vitals:   03/26/15 1530 03/26/15 1545  BP: 125/59   Pulse: 69 78  Temp:    Resp: 13 17    Last Pain:  Filed Vitals:   03/26/15 1552  PainSc: 6                  Tu Shimmel A

## 2015-03-26 NOTE — Transfer of Care (Signed)
Immediate Anesthesia Transfer of Care Note  Patient: Amy Curtis  Procedure(s) Performed: Procedure(s): TOTAL KNEE ARTHROPLASTY (Left)  Patient Location: PACU  Anesthesia Type:General and Regional  Level of Consciousness: awake, alert , oriented and patient cooperative  Airway & Oxygen Therapy: Patient Spontanous Breathing and Patient connected to face mask oxygen  Post-op Assessment: Report given to RN, Post -op Vital signs reviewed and stable, Patient moving all extremities X 4 and Patient able to stick tongue midline  Post vital signs: Reviewed and stable  Last Vitals:  Filed Vitals:   03/26/15 1025  BP: 132/65  Pulse: 82  Temp: 36.8 C    Complications: No apparent anesthesia complications

## 2015-03-26 NOTE — Op Note (Signed)
NAMFleet Contras:  Curtis, Amy              ACCOUNT NO.:  [id number]  MEDICAL RECORD NO.:  [id number]  LOCATION:  [facility]                         FACILITY:  [facility]  PHYSICIAN:  Mark C. Ophelia CharterYates, M.D.    DATE OF BIRTH:  19-Mar-1940  DATE OF PROCEDURE:  03/26/2015 DATE OF DISCHARGE:                              OPERATIVE REPORT   PREOPERATIVE DIAGNOSIS:  Left knee osteoarthritis.  POSTOPERATIVE DIAGNOSIS:  Left knee osteoarthritis.  PROCEDURE:  Left cemented total knee arthroplasty.  SURGEON:  Mark C. Ophelia CharterYates, M.D.  ASSISTANT:  Genene ChurnJames M. Barry Dieneswens, PA-C.  ANESTHESIA:  General plus preoperative adductor block.  IMPLANTS:  A #3 DePuy LCS rotating platform femur cemented with lugs.  A 10-mm poly and #3 tibia.  A 35-mm 3-peg all poly patella.  DESCRIPTION OF PROCEDURE:  After induction of general anesthesia, proximal thigh tourniquet, Foley catheter was inserted.  The patient had some urinary leakage under general anesthesia and catheter was inserted. The knee was prepped from ankle to the tourniquet with DuraPrep.  The usual total knee drapes, sheets, impervious stockinette, and Coban were applied.  Sterile skin marker from the midline incision longitudinal and Betadine and Steri-Drape used to seal the skin.  Time-out procedure was completed.  Ancef was given prophylactically.  Midline incision was made.  She had a thick adipose layer.  Subcutaneous tissue was cut down to the superficial retinaculum which was divided.  Medial deep retinacular parapatellar incision along the medial aspect was performed after sterile skin marker was used to outline a mark adjacent to the patella for later closure.  The quad tendon was split between the medial one-third and lateral two thirds.  Patella was everted and 10 mm were resected.  Trial sizers were held up.  The 3 or 4 narrow was appropriate.  Intramedullary hole was drilled in the femur.  Distal cuts were made on the femur taking 9 mm.  Chamfer cuts and box cuts  were made.  #3 was appropriate for the tibia.  8-9 mm was taken off on the tibial side using the stylus.  Three keel preparation followed by insertion of trial components there was full extension.  Collateral ligaments were balanced.  Trials removed, pulse lavage, vacuum mixing, meticulous drying of the bone.  Tibia was cemented first, followed by femur, placed in the 10-mm LCS rotating platform, and then patellar component held with a patellar clamp.  All excess cement was removed. Cement was hardened for 15 minutes.  Tourniquet was deflated for tourniquet time less than 45 minutes.  Pulse lavage once again, meticulous hemostasis and then standard closure with nonabsorbable #1 in the deep fascia, 2-0 on subcutaneous tissue, superficial retinaculum and skin staple closure.  Postop dressing knee immobilizer was applied.  The patient tolerated the procedure well.     Mark C. Ophelia CharterYates, M.D.     [facility]/MEDQ  D:  03/26/2015  T:  03/26/2015  Job:  [id number]

## 2015-03-26 NOTE — Anesthesia Preprocedure Evaluation (Addendum)
Anesthesia Evaluation  Patient identified by MRN, date of birth, ID band Patient awake    Reviewed: Allergy & Precautions, H&P , NPO status , Patient's Chart, lab work & pertinent test results  Airway Mallampati: II  TM Distance: >3 FB Neck ROM: Full    Dental  (+) Edentulous Lower, Edentulous Upper   Pulmonary shortness of breath, sleep apnea ,    breath sounds clear to auscultation + decreased breath sounds      Cardiovascular hypertension,  Rhythm:Regular Rate:Normal     Neuro/Psych PSYCHIATRIC DISORDERS Anxiety Depression    GI/Hepatic hiatal hernia, GERD  ,  Endo/Other  diabetes, Type 2, Oral Hypoglycemic AgentsHypothyroidism Morbid obesity  Renal/GU      Musculoskeletal  (+) Arthritis ,   Abdominal   Peds  Hematology   Anesthesia Other Findings   Reproductive/Obstetrics                           Anesthesia Physical  Anesthesia Plan  ASA: III  Anesthesia Plan: General and Regional   Post-op Pain Management: MAC Combined w/ Regional for Post-op pain   Induction: Intravenous  Airway Management Planned: Oral ETT  Additional Equipment:   Intra-op Plan:   Post-operative Plan: Extubation in OR  Informed Consent: I have reviewed the patients History and Physical, chart, labs and discussed the procedure including the risks, benefits and alternatives for the proposed anesthesia with the patient or authorized representative who has indicated his/her understanding and acceptance.   Dental advisory given  Plan Discussed with: CRNA  Anesthesia Plan Comments:        Anesthesia Quick Evaluation

## 2015-03-26 NOTE — Anesthesia Procedure Notes (Addendum)
Procedure Name: Intubation Date/Time: 03/26/2015 12:36 PM Performed by: Salomon MastWALL, WILLIAM COREY Pre-anesthesia Checklist: Patient identified, Emergency Drugs available, Suction available and Patient being monitored Patient Re-evaluated:Patient Re-evaluated prior to inductionOxygen Delivery Method: Circle system utilized Preoxygenation: Pre-oxygenation with 100% oxygen Intubation Type: IV induction Laryngoscope Size: Mac and 3 Grade View: Grade I Tube type: Oral Number of attempts: 1 Airway Equipment and Method: Stylet Placement Confirmation: ETT inserted through vocal cords under direct vision,  positive ETCO2 and breath sounds checked- equal and bilateral Secured at: 22 cm Tube secured with: Tape Dental Injury: Teeth and Oropharynx as per pre-operative assessment    Anesthesia Regional Block:  Adductor canal block  Pre-Anesthetic Checklist: ,, timeout performed, Correct Patient, Correct Site, Correct Laterality, Correct Procedure, Correct Position, site marked, Risks and benefits discussed, Surgical consent,  Pre-op evaluation,  Post-op pain management  Laterality: Left  Prep: chloraprep       Needles:  Injection technique: Single-shot  Needle Type: Stimiplex     Needle Length: 9cm 9 cm Needle Gauge: 21 and 21 G    Additional Needles:  Procedures: ultrasound guided (picture in chart) Adductor canal block Narrative:  Injection made incrementally with aspirations every 5 mL.  Performed by: Personally  Anesthesiologist: Lewie LoronGERMEROTH, Yecheskel Kurek  Additional Notes: BP cuff, EKG monitors applied. Sedation begun. Artery and nerve location verified with U/S and anesthetic injected incrementally, slowly, and after negative aspirations under direct u/s guidance. Good fascial /perineural spread. Tolerated well.

## 2015-03-26 NOTE — Brief Op Note (Signed)
03/26/2015  2:41 PM  PATIENT:  Fleet ContrasVirginia Santori  75 y.o. female  PRE-OPERATIVE DIAGNOSIS:  Left Knee Osteoarthritis  POST-OPERATIVE DIAGNOSIS:  Left Knee Osteoarthritis  PROCEDURE:  Procedure(s): TOTAL KNEE ARTHROPLASTY (Left)  SURGEON:  Surgeon(s) and Role:    Eldred Manges* Mark C Yates, MD - Primary  PHYSICIAN ASSISTANT: Geraldine Tesar m. Denzel Etienne pa-c    ANESTHESIA:   general  EBL:  Total I/O In: 1900 [I.V.:1900] Out: 50 [Urine:25; Blood:25]  BLOOD ADMINISTERED:none  DRAINS: none   LOCAL MEDICATIONS USED:  NONE  SPECIMEN:  No Specimen  DISPOSITION OF SPECIMEN:  N/A  COUNTS:  YES  TOURNIQUET:   Total Tourniquet Time Documented: Thigh (Left) - 45 minutes Total: Thigh (Left) - 45 minutes   DICTATION: .Reubin Milanragon Dictation  PLAN OF CARE: Admit to inpatient   PATIENT DISPOSITION:  PACU - hemodynamically stable.

## 2015-03-27 ENCOUNTER — Encounter (HOSPITAL_COMMUNITY): Payer: Self-pay | Admitting: Orthopaedic Surgery

## 2015-03-27 LAB — CBC
HEMATOCRIT: 29.9 % — AB (ref 36.0–46.0)
Hemoglobin: 10.3 g/dL — ABNORMAL LOW (ref 12.0–15.0)
MCH: 30.6 pg (ref 26.0–34.0)
MCHC: 34.4 g/dL (ref 30.0–36.0)
MCV: 88.7 fL (ref 78.0–100.0)
PLATELETS: 178 10*3/uL (ref 150–400)
RBC: 3.37 MIL/uL — AB (ref 3.87–5.11)
RDW: 13.2 % (ref 11.5–15.5)
WBC: 9.6 10*3/uL (ref 4.0–10.5)

## 2015-03-27 LAB — BASIC METABOLIC PANEL
ANION GAP: 9 (ref 5–15)
BUN: 12 mg/dL (ref 6–20)
CHLORIDE: 96 mmol/L — AB (ref 101–111)
CO2: 28 mmol/L (ref 22–32)
Calcium: 9.2 mg/dL (ref 8.9–10.3)
Creatinine, Ser: 0.68 mg/dL (ref 0.44–1.00)
GFR calc Af Amer: >60 mL/min (ref >60)
GFR calc non Af Amer: >60 mL/min (ref >60)
GLUCOSE: 175 mg/dL — AB (ref 65–99)
POTASSIUM: 3.2 mmol/L — AB (ref 3.5–5.1)
Sodium: 133 mmol/L — ABNORMAL LOW (ref 135–145)

## 2015-03-27 LAB — GLUCOSE, CAPILLARY: Glucose-Capillary: 168 mg/dL — ABNORMAL HIGH (ref 65–99)

## 2015-03-27 NOTE — Progress Notes (Signed)
Physical Therapy Progress Note Pt performing transfers and HEP better than AM session but limited gait distance due to fatigue. Pt educated for HEP, KI use and mobility. Will continue to follow with pt encouraged to have CPM placed after lunch.   03/27/15 1200  PT Visit Information  Last PT Received On 03/27/15  Assistance Needed +1  History of Present Illness 75 yo admitted for L TKA. PMHx: spinal stenosis, HTN, sleep apnea, glaucoma, anxiety, GERD, HOH  PT Time Calculation  PT Start Time (ACUTE ONLY) 1158  PT Stop Time (ACUTE ONLY) 1227  PT Time Calculation (min) (ACUTE ONLY) 29 min  Precautions  Precautions Knee;Fall  Required Braces or Orthoses Knee Immobilizer - Left  Knee Immobilizer - Left On except when in CPM  Restrictions  Weight Bearing Restrictions Yes  LLE Weight Bearing WBAT  Pain Assessment  Pain Score 3  Pain Location left knee  Pain Descriptors / Indicators Sore  Pain Intervention(s) Limited activity within patient's tolerance;Premedicated before session;Repositioned  Cognition  Arousal/Alertness Awake/alert  Behavior During Therapy WFL for tasks assessed/performed  Overall Cognitive Status Within Functional Limits for tasks assessed  Bed Mobility  Overal bed mobility Needs Assistance  Bed Mobility Sit to Supine  Sit to supine Min assist  General bed mobility comments cues for seqeunce with assist to bring legs onto surface  Transfers  Overall transfer level Needs assistance  Sit to Stand Min assist  General transfer comment cues for sequence and safety  Ambulation/Gait  Ambulation/Gait assistance Min assist  Ambulation Distance (Feet) 40 Feet  Assistive device Rolling walker (2 wheeled)  Gait Pattern/deviations Step-to pattern;Trunk flexed;Decreased stance time - left  General Gait Details cues for posture, position in RW, sequence and safety  Gait velocity interpretation Below normal speed for age/gender  Exercises  Exercises Total Joint  Total Joint  Exercises  Straight Leg Raises AROM;Left;10 reps;Seated  Hip ABduction/ADduction AROM;10 reps;Seated  Long Arc Quad AROM;Left;10 reps;Seated  Knee Flexion AAROM;Left;10 reps;Seated  Goniometric ROM 10-63  PT - End of Session  Equipment Utilized During Treatment Gait belt  Activity Tolerance Patient tolerated treatment well  Patient left in bed;with call bell/phone within reach;with bed alarm set  Nurse Communication Mobility status  PT - Assessment/Plan  PT Plan Current plan remains appropriate  Follow Up Recommendations SNF  PT Goal Progression  Progress towards PT goals Progressing toward goals  PT General Charges  $$ ACUTE PT VISIT 1 Procedure  PT Treatments  $Gait Training 8-22 mins  $Therapeutic Exercise 8-22 mins  [id number] Pruett [place], PT [phone]

## 2015-03-27 NOTE — Care Management Note (Signed)
Case Management Note  Patient Details  Name: Amy Curtis MRN: [id number] Date of Birth: 30-Apr-1940  Subjective/Objective:          S/p left total knee arthroplasty          Action/Plan: PT recommending SNF. Referral made to CSW for SNF placement for short term rehab. Will continue to follow.  Expected Discharge Date:                  Expected Discharge Plan:  Skilled Nursing Facility  In-House Referral:  Clinical Social Work  Discharge planning Services  CM Consult  Post Acute Care Choice:  NA Choice offered to:     DME Arranged:    DME Agency:     HH Arranged:    HH Agency:     Status of Service:  In process, will continue to follow  Medicare Important Message Given:    Date Medicare IM Given:    Medicare IM give by:    Date Additional Medicare IM Given:    Additional Medicare Important Message give by:     If discussed at Long Length of Stay Meetings, dates discussed:    Additional Comments:  Monica BectonKrieg, Laura Radilla Watson, RN 03/27/2015, 11:41 AM

## 2015-03-27 NOTE — Progress Notes (Signed)
Orthopedic Tech Progress Note Patient Details:  Amy Curtis 04/25/1940 [id number]  Patient ID: Amy Curtis, female   DOB: 04/25/1940, 75 y.o.   MRN: [id number] Applied cpm 0-60  Trinna PostMartinez, [name] 03/27/2015, 5:57 AM

## 2015-03-27 NOTE — Progress Notes (Signed)
Orthopedic Tech Progress Note Patient Details:  Fleet ContrasVirginia Rada Nov 25, 1940 [id number]  CPM Left Knee CPM Left Knee: On Left Knee Flexion (Degrees): 60 Left Knee Extension (Degrees): 0 Additional Comments: trapeze bar patient helper   Saul FordyceJennifer C Wyndi Northrup 03/27/2015, 8:16 PM

## 2015-03-27 NOTE — Evaluation (Signed)
Physical Therapy Evaluation Patient Details Name: Amy Curtis MRN: [id number] DOB: 28-Apr-1940 Today's Date: 03/27/2015   History of Present Illness  75 yo admitted for L TKA. PMHx: spinal stenosis, HTN, sleep apnea, glaucoma, anxiety, GERD, HOH  Clinical Impression  Pt pleasant and tolerating limited gait distance today. Pt with decreased quad strength and knee flexion as well as impaired mobility and gait who will benefit from acute therapy to maximize mobility, function and independence. Pt educated for CPM use, KI use, transfers, gait and plan.     Follow Up Recommendations SNF    Equipment Recommendations  Rolling walker with 5" wheels    Recommendations for Other Services OT consult     Precautions / Restrictions Precautions Precautions: Knee;Fall Required Braces or Orthoses: Knee Immobilizer - Left Knee Immobilizer - Left: On except when in CPM Restrictions LLE Weight Bearing: Weight bearing as tolerated      Mobility  Bed Mobility Overal bed mobility: Needs Assistance Bed Mobility: Supine to Sit     Supine to sit: Min assist;HOB elevated     General bed mobility comments: cues for seqeunce with assist to bring legs to EOb and elevate trunk  Transfers Overall transfer level: Needs assistance   Transfers: Sit to/from Stand;Stand Pivot Transfers Sit to Stand: Min assist;+2 safety/equipment Stand pivot transfers: Min assist       General transfer comment: max cues for sequence, posture and safety. pt can be impulsive with transfers and needs cues to prevent fall  Ambulation/Gait Ambulation/Gait assistance: Min assist Ambulation Distance (Feet): 50 Feet Assistive device: Rolling walker (2 wheeled) Gait Pattern/deviations: Step-to pattern;Decreased stride length;Trunk flexed;Wide base of support   Gait velocity interpretation: Below normal speed for age/gender General Gait Details: cues for posture, position in RW, sequence and safety  Stairs             Wheelchair Mobility    Modified Rankin (Stroke Patients Only)       Balance                                             Pertinent Vitals/Pain Pain Assessment: 0-10 Pain Score: 3  Pain Location: left knee Pain Descriptors / Indicators: Aching Pain Intervention(s): Limited activity within patient's tolerance;Repositioned;Monitored during session    Home Living Family/patient expects to be discharged to:: Private residence Living Arrangements: Alone (spouse is in SNF)   Type of Home: House Home Access: Ramped entrance     Home Layout: One level Home Equipment: Environmental consultantWalker - 4 wheels;Bedside commode;Wheelchair - manual      Prior Function Level of Independence: Independent               Hand Dominance        Extremity/Trunk Assessment   Upper Extremity Assessment: Generalized weakness           Lower Extremity Assessment: RLE deficits/detail RLE Deficits / Details: decreased strength and ROM post op    Cervical / Trunk Assessment: Kyphotic  Communication   Communication: HOH  Cognition Arousal/Alertness: Awake/alert Behavior During Therapy: WFL for tasks assessed/performed Overall Cognitive Status: Within Functional Limits for tasks assessed                      General Comments      Exercises Total Joint Exercises Heel Slides: AAROM;Left;5 reps;Supine Straight Leg Raises: AAROM;Left;5 reps;Supine  Assessment/Plan    PT Assessment Patient needs continued PT services  PT Diagnosis Difficulty walking;Generalized weakness;Acute pain   PT Problem List Decreased strength;Decreased range of motion;Decreased activity tolerance;Decreased balance;Pain;Decreased knowledge of use of DME;Obesity;Decreased mobility  PT Treatment Interventions Functional mobility training;Therapeutic activities;Therapeutic exercise;Gait training;DME instruction;Patient/family education   PT Goals (Current goals can be found in the Care  Plan section) Acute Rehab PT Goals Patient Stated Goal: be able to return home with my husband soon PT Goal Formulation: With patient Time For Goal Achievement: 04/03/15 Potential to Achieve Goals: Fair    Frequency 7X/week   Barriers to discharge Decreased caregiver support      Co-evaluation               End of Session Equipment Utilized During Treatment: Gait belt Activity Tolerance: Patient tolerated treatment well Patient left: in chair;with call bell/phone within reach;with nursing/sitter in room Nurse Communication: Mobility status         Time: 0833-0900 PT Time Calculation (min) (ACUTE ONLY): 27 min   Charges:   PT Evaluation $PT Eval Moderate Complexity: 1 Procedure PT Treatments $Gait Training: 8-22 mins   PT G CodesDelorse Lek 03/27/2015, 9:07 AM [name], PT [phone]

## 2015-03-27 NOTE — Progress Notes (Signed)
Orthopedic Tech Progress Note Patient Details:  Amy Curtis 1940-07-29 [id number]  CPM Left Knee CPM Left Knee: On Left Knee Flexion (Degrees): 60 Left Knee Extension (Degrees): 0 Additional Comments: trapeze bar patient helper   Saul FordyceJennifer C Amy Curtis 03/27/2015, 1:53 PM

## 2015-03-27 NOTE — Evaluation (Signed)
Occupational Therapy Evaluation Patient Details Name: Amy Curtis MRN: [id number] DOB: 02-Feb-1940 Today's Date: 03/27/2015    History of Present Illness 75 yo admitted for L TKA. PMHx: spinal stenosis, HTN, sleep apnea, glaucoma, anxiety, GERD, HOH   Clinical Impression   Patient presenting with decreased ADL and functional mobility independence secondary to above. Patient independent to mod I PTA. Patient currently functioning at an overall min to max assist level. Patient will benefit from acute OT to increase overall independence in the areas of ADLs, functional mobility, and overall safety in order to safely discharge to venue listed below.     Follow Up Recommendations  SNF;Supervision/Assistance - 24 hour    Equipment Recommendations  Other (comment) (TBD next venue of care)    Recommendations for Other Services  None at this time   Precautions / Restrictions Precautions Precautions: Knee;Fall Precaution Comments: Reviewed no pillow under knee Required Braces or Orthoses: Knee Immobilizer - Left Knee Immobilizer - Left: On except when in CPM Restrictions Weight Bearing Restrictions: Yes LLE Weight Bearing: Weight bearing as tolerated    Mobility Bed Mobility Overal bed mobility: Needs Assistance Bed Mobility: Sit to Supine;Supine to Sit     Supine to sit: Min assist;HOB elevated Sit to supine: Min assist   General bed mobility comments: cues for seqeunce with assist for LLE management   Transfers Overall transfer level: Needs assistance Equipment used: Rolling walker (2 wheeled) Transfers: Sit to/from Stand Sit to Stand: Min assist General transfer comment: cues for sequence and safety    Balance Overall balance assessment: Needs assistance Sitting-balance support: No upper extremity supported;Feet supported Sitting balance-[name]: Good     Standing balance support: Bilateral upper extremity supported;During functional activity Standing  balance-[name]: Fair    ADL Overall ADL's : Needs assistance/impaired Eating/Feeding: Set up;Bed level   Grooming: Set up;Bed level   Upper Body Bathing: Minimal assitance;Sitting   Lower Body Bathing: Moderate assistance;Sit to/from stand   Upper Body Dressing : Minimal assistance;Sitting   Lower Body Dressing: Maximal assistance;Sit to/from stand   Toilet Transfer: Minimal assistance;Ambulation;RW;BSC Toilet Transfer Details (indicate cue type and reason): simulated    Toileting - Clothing Manipulation Details (indicate cue type and reason): did not occur   Tub/Shower Transfer Details (indicate cue type and reason): did not occur    Vision Additional Comments: no change from baseline          Pertinent Vitals/Pain Pain Assessment: Faces Pain Score: 3  Faces Pain Scale: Hurts little more Pain Location: left knee Pain Descriptors / Indicators: Sore;Aching Pain Intervention(s): Limited activity within patient's tolerance;Monitored during session;Repositioned     Hand Dominance Right   Extremity/Trunk Assessment Upper Extremity Assessment Upper Extremity Assessment: Generalized weakness   Lower Extremity Assessment Lower Extremity Assessment: Defer to PT evaluation   Cervical / Trunk Assessment Cervical / Trunk Assessment: Kyphotic   Communication Communication Communication: No difficulties;HOH   Cognition Arousal/Alertness: Awake/alert Behavior During Therapy: WFL for tasks assessed/performed Overall Cognitive Status: Within Functional Limits for tasks assessed             Home Living Family/patient expects to be discharged to:: Private residence Living Arrangements: Alone (spouse is in SNF) Available Help at Discharge: Family;Available PRN/intermittently Type of Home: House Home Access: Ramped entrance     Home Layout: One level     Bathroom Shower/Tub: Walk-in shower;Door   Foot LockerBathroom Toilet: Standard     Home Equipment: Environmental consultantWalker - 4  wheels;Bedside commode;Wheelchair - manual   Prior Functioning/Environment Level  of Independence: Independent     OT Diagnosis: Generalized weakness;Acute pain   OT Problem List: Decreased strength;Decreased range of motion;Decreased activity tolerance;Impaired balance (sitting and/or standing);Decreased safety awareness;Decreased knowledge of use of DME or AE;Decreased knowledge of precautions;Pain   OT Treatment/Interventions: Self-care/ADL training;Therapeutic exercise;Energy conservation;DME and/or AE instruction;Therapeutic activities;Patient/family education;Balance training    OT Goals(Current goals can be found in the care plan section) Acute Rehab OT Goals Patient Stated Goal: be able to return home with my husband soon OT Goal Formulation: With patient Time For Goal Achievement: 04/03/15 Potential to Achieve Goals: Good ADL Goals Pt Will Perform Grooming: with supervision;standing Pt Will Perform Lower Body Bathing: with supervision;sit to/from stand (using AE prn) Pt Will Perform Lower Body Dressing: with supervision;sit to/from stand (using AE prn) Pt Will Transfer to Toilet: with supervision;ambulating;bedside commode Additional ADL Goal #1: Pt will be mod I with bed mobility as a precursor for ADLs Additional ADL Goal #2: Pt will perform functional ambulation using RW with supervision during ADLs  OT Frequency: Min 2X/week   Barriers to D/C: Decreased caregiver support   End of Session Equipment Utilized During Treatment: Gait belt;Rolling walker;Left knee immobilizer CPM Left Knee CPM Left Knee: Off  Activity Tolerance: Patient tolerated treatment well Patient left: in bed;with call bell/phone within reach;with bed alarm set   Time: 1610-9604 OT Time Calculation (min): 11 min Charges:  OT General Charges $OT Visit: 1 Procedure OT Evaluation $OT Eval Moderate Complexity: 1 Procedure  Edwin Cap , MS, OTR/L, CLT Pager: [phone]  03/27/2015, 2:02 PM

## 2015-03-27 NOTE — Progress Notes (Signed)
Subjective: Patient seen this AM.  Doing well.  Pain controlled.  Limited assistance at home.  [place].   Objective: Vital signs in last 24 hours: Temp:  [98.6 F (37 C)-98.8 F (37.1 C)] 98.8 F (37.1 C) (03/09 0500) Pulse Rate:  [89-99] 98 (03/09 1243) Resp:  [18-20] 18 (03/09 1243) BP: (108-156)/(54-70) 156/70 mmHg (03/09 1243) SpO2:  [97 %-98 %] 97 % (03/09 1243)  Intake/Output from previous day: 03/08 0701 - 03/09 0700 In: 2150 [P.O.:250; I.V.:1900] Out: 250 [Urine:225; Blood:25] Intake/Output this shift: Total I/O In: 480 [P.O.:480] Out: 1000 [Urine:1000]   Recent Labs  03/27/15 0439  HGB 10.3*    Recent Labs  03/27/15 0439  WBC 9.6  RBC 3.37*  HCT 29.9*  PLT 178    Recent Labs  03/27/15 0439  NA 133*  K 3.2*  CL 96*  CO2 28  BUN 12  CREATININE 0.68  GLUCOSE 175*  CALCIUM 9.2   No results for input(s): LABPT, INR in the last 72 hours.  Exam;  Alert and oriented.  NAD.  Dressing C/D/I.  bilat calves nontender.  NVI.   Assessment/Plan: Social work consult ordered.  Will need snf placement for rehab. [place] facility in [place]. D/c dilaudid.    Kyler Germer M 03/27/2015, 5:20 PM

## 2015-03-28 LAB — CBC
HEMATOCRIT: 31 % — AB (ref 36.0–46.0)
HEMOGLOBIN: 10.2 g/dL — AB (ref 12.0–15.0)
MCH: 28.7 pg (ref 26.0–34.0)
MCHC: 32.9 g/dL (ref 30.0–36.0)
MCV: 87.1 fL (ref 78.0–100.0)
Platelets: 177 10*3/uL (ref 150–400)
RBC: 3.56 MIL/uL — ABNORMAL LOW (ref 3.87–5.11)
RDW: 12.9 % (ref 11.5–15.5)
WBC: 10.4 10*3/uL (ref 4.0–10.5)

## 2015-03-28 MED ORDER — OXYCODONE-ACETAMINOPHEN 5-325 MG PO TABS: 1.0000 | ORAL_TABLET | Freq: Four times a day (QID) | ORAL | Status: DC | PRN

## 2015-03-28 MED ORDER — WHITE PETROLATUM GEL
Status: AC
  Filled 2015-03-28: qty 1

## 2015-03-28 MED ORDER — ASPIRIN 325 MG PO TBEC: 325.0000 mg | DELAYED_RELEASE_TABLET | Freq: Every day | ORAL | Status: DC

## 2015-03-28 MED ORDER — METHOCARBAMOL 500 MG PO TABS: 500.0000 mg | ORAL_TABLET | Freq: Four times a day (QID) | ORAL | Status: DC | PRN

## 2015-03-28 NOTE — Clinical Social Work Note (Addendum)
CSW received referral for SNF, CSW met with patient who states that she would rather go home with home health instead of going to a SNF.  Patient expressed that she will have her grandaughter who is a CNA stay with her and also her son will be able to stay with her as well.  CSW explained to patient the benefit of going to a SNF verse going home with home health, patient still feels she wants to go home with home health.  CSW to update case Freight forwarder.  Plan is to discharge home, CSW to sign off please re-consult if social work needs arise.  Jones Broom. Eaton, MSW, [place] 03/28/2015 11:01 AM

## 2015-03-28 NOTE — Care Management Note (Signed)
Case Management Note  Patient Details  Name: Amy Curtis MRN: [id number] Date of Birth: 07-02-1940  Subjective/Objective:      S/p left total knee arthroplasty              Action/Plan: Patient progressed with PT, recommendation is now for HHPT. Spoke with patient and daughters about change in discharge plan. Patient wanting to go home, patient and daughters stating that patient will have 24/7 assistance. [facility] but they are not in patient's insurance network. Contacted [facility], spoke with Dondra SpryGail, faxed demographics, order, face to face,H and P, op note and d/c summary.[facility], they will start therapy on 03/31/15. PT instructed patient to do HEP over the weekend. Contacted [facility],rolling walker delivered to patient's room.    Expected Discharge Date:                  Expected Discharge Plan:  Home w Home Health Services  In-House Referral:  Clinical Social Work  Discharge planning Services  CM Consult  Post Acute Care Choice:  Durable Medical Equipment, Home Health Choice offered to:  Patient  DME Arranged:  Walker rolling DME Agency:  [facility]  [facility]  [facility]  Status of Service:  Completed, signed off  Medicare Important Message Given:  Yes Date Medicare IM Given:    Medicare IM give by:    Date Additional Medicare IM Given:    Additional Medicare Important Message give by:     If discussed at Long Length of Stay Meetings, dates discussed:    Additional Comments:  Monica BectonKrieg, Rupal Childress Watson, RN 03/28/2015, 2:02 PM

## 2015-03-28 NOTE — Progress Notes (Signed)
Chaplain stopped by to visit with Pt. However Pt. was asleep. Chaplain is more than happy to stop by again. Please page chaplain when pt. is awake and if Pt. wants a visit.    Thanks  Chaplain [employer]Beatrice Jachob Mcclean

## 2015-03-28 NOTE — Progress Notes (Signed)
Subjective: Doing well.  Pain controlled.  No c/o cp, sob.  Needs snf placement for rehab.    Objective: Vital signs in last 24 hours: Temp:  [98.7 F (37.1 C)-99.1 F (37.3 C)] 99.1 F (37.3 C) (03/10 0521) Pulse Rate:  [91-98] 91 (03/10 0521) Resp:  [18] 18 (03/10 0521) BP: (149-170)/(68-79) 149/79 mmHg (03/10 0842) SpO2:  [93 %-97 %] 93 % (03/10 0521)  Intake/Output from previous day: 03/09 0701 - 03/10 0700 In: 720 [P.O.:720] Out: 1000 [Urine:1000] Intake/Output this shift: Total I/O In: 240 [P.O.:240] Out: -    Recent Labs  03/27/15 0439 03/28/15 0449  HGB 10.3* 10.2*    Recent Labs  03/27/15 0439 03/28/15 0449  WBC 9.6 10.4  RBC 3.37* 3.56*  HCT 29.9* 31.0*  PLT 178 177    Recent Labs  03/27/15 0439  NA 133*  K 3.2*  CL 96*  CO2 28  BUN 12  CREATININE 0.68  GLUCOSE 175*  CALCIUM 9.2   No results for input(s): LABPT, INR in the last 72 hours.  Exam:  Knee wound looks good.  Staples intact.  No drainage or signs of infection.  Calf nontender.  NAD, alert and oriented.  Assessment/Plan: Continue present care.  Transfer to SNF when bed available.  Scripts on chart for percocet, robaxin, aspirin(dvt prophylaxis) on chart.     OWENS,JAMES M 03/28/2015, 10:39 AM

## 2015-03-28 NOTE — Care Management Important Message (Signed)
Important Message  Patient Details  Name: Amy Curtis MRN: [id number] Date of Birth: December 03, 1940   Medicare Important Message Given:  Yes    Oralia RudMegan P Sofya Moustafa 03/28/2015, 1:04 PM

## 2015-03-28 NOTE — Progress Notes (Signed)
Occupational Therapy Treatment Patient Details Name: Amy Curtis MRN: [id number] DOB: [birth year] Today's Date: 03/28/2015    History of present illness 75 yo admitted for L TKA. PMHx: spinal stenosis, HTN, sleep apnea, glaucoma, anxiety, GERD, HOH   OT comments  Pt making good progress. Plan is to D/C home with 24/7 S of family. Recommend pt follow up with HHOT. Acute OT education completed. Pt safe to D/C home with family.   Follow Up Recommendations  Supervision/Assistance - 24 hour;Home health OT    Equipment Recommendations  None recommended by OT    Recommendations for Other Services      Precautions / Restrictions Precautions Precautions: Knee;Fall Required Braces or Orthoses: Knee Immobilizer - Left Knee Immobilizer - Left: On except when in CPM Restrictions Weight Bearing Restrictions: Yes LLE Weight Bearing: Weight bearing as tolerated       Mobility Bed Mobility   General bed mobility comments: OOB in chair  Transfers Overall transfer level: Needs assistance Equipment used: Rolling walker (2 wheeled) Transfers: Sit to/from Stand Sit to Stand: Supervision         General transfer comment: safe technique utilized    Balance Overall balance assessment: Needs assistance Sitting-balance support: No upper extremity supported Sitting balance-[name]: Good     Standing balance support: No upper extremity supported Standing balance-[name]: Fair Standing balance comment:                    ADL                                     Tub/Shower Transfer Details (indicate cue type and reason): Educated pt/family on walk i shower tranfer technique. Pt retrn demonstrated with min A to steady when stepping over threshold. Grand daughter verbalized understanding. also educated on compensatory techniques for ADL. Pt able to retrun demonstrate. Completed LB dressing with min A  Good balance during ADL. Pt able to stand with S to donn  pants over hips. Educated family on donning KI. Discussed clothing management with KI. Encouraged pt to use ice for edema and pain control.         Vision                     Perception     Praxis      Cognition   Behavior During Therapy: WFL for tasks assessed/performed Overall Cognitive Status: Within Functional Limits for tasks assessed                       Extremity/Trunk Assessment               Exercises     Shoulder Instructions       General Comments      Pertinent Vitals/ Pain       Pain Assessment: 0-10 Pain Score: 4  Pain Location: L knee Pain Descriptors / Indicators: Aching Pain Intervention(s): Limited activity within patient's tolerance;Repositioned  Home Living                                          Prior Functioning/Environment              Frequency       Progress Toward Goals  OT Goals(current goals can now  be found in the care plan section)  Progress towards OT goals: Progressing toward goals  Acute Rehab OT Goals Patient Stated Goal: go home OT Goal Formulation: With patient Time For Goal Achievement: 04/03/15 Potential to Achieve Goals: Good ADL Goals Pt Will Perform Grooming: with supervision;standing Pt Will Perform Lower Body Bathing: with supervision;sit to/from stand Pt Will Perform Lower Body Dressing: with supervision;sit to/from stand Pt Will Transfer to Toilet: with supervision;ambulating;bedside commode Additional ADL Goal #1: Pt will be mod I with bed mobility as a precursor for ADLs Additional ADL Goal #2: Pt will perform functional ambulation using RW with supervision during ADLs  Plan Discharge plan needs to be updated    Co-evaluation                 End of Session Equipment Utilized During Treatment: Gait belt;Rolling walker;Left knee immobilizer   Activity Tolerance Patient tolerated treatment well   Patient Left in chair;with call bell/phone within  reach;with family/visitor present   Nurse Communication Mobility status        Time: 1435-1455 OT Time Calculation (min): 20 min  Charges: OT General Charges $OT Visit: 1 Procedure OT Treatments $Self Care/Home Management : 8-22 mins  Kristee Angus,HILLARY 03/28/2015, 3:09 PM   [facility] Kirschenmann, OTR/L  [phone] 03/28/2015

## 2015-03-28 NOTE — Progress Notes (Signed)
Physical Therapy Treatment Patient Details Name: Amy Curtis MRN: [id number] DOB: October 01, 1940 Today's Date: 03/28/2015    History of Present Illness 75 yo admitted for L TKA. PMHx: spinal stenosis, HTN, sleep apnea, glaucoma, anxiety, GERD, HOH    PT Comments    Patient continues to progress with PT. Session focused on HEP including pt/family education regarding technique and frequency. Pt/family deny questions or concerns following session. Anticipate D/C home with family assistance.   Follow Up Recommendations  Home health PT;Supervision for mobility/OOB     Equipment Recommendations  Rolling walker with 5" wheels    Recommendations for Other Services       Precautions / Restrictions Precautions Precautions: Knee;Fall Required Braces or Orthoses: Knee Immobilizer - Left Knee Immobilizer - Left: On except when in CPM Restrictions Weight Bearing Restrictions: Yes LLE Weight Bearing: Weight bearing as tolerated    Mobility  Bed Mobility Overal bed mobility: Needs Assistance Bed Mobility: Supine to Sit     Supine to sit: Min assist     General bed mobility comments: min assist with trunk  Transfers Overall transfer level: Needs assistance Equipment used: Rolling walker (2 wheeled) Transfers: Sit to/from Stand Sit to Stand: Supervision         General transfer comment: rw sized to pt  Ambulation/Gait Ambulation/Gait assistance: Min guard Ambulation Distance (Feet): 10 Feet Assistive device: Rolling walker (2 wheeled) Gait Pattern/deviations: Step-through pattern Gait velocity: decreased       Stairs            Wheelchair Mobility    Modified Rankin (Stroke Patients Only)       Balance Overall balance assessment: Needs assistance Sitting-balance support: No upper extremity supported Sitting balance-[name]: Good     Standing balance support: No upper extremity supported Standing balance-[name]: Fair                       Cognition Arousal/Alertness: Awake/alert Behavior During Therapy: WFL for tasks assessed/performed Overall Cognitive Status: Within Functional Limits for tasks assessed                      Exercises Total Joint Exercises Ankle Circles/Pumps: AROM;Both;15 reps Quad Sets: Strengthening;Left;10 reps Towel Squeeze: Strengthening;Both;10 reps Short Arc Quad: Strengthening;Left;10 reps Heel Slides: AAROM;Left;10 reps Hip ABduction/ADduction: Strengthening;Left;10 reps Straight Leg Raises: Strengthening;Left;10 reps Long Arc Quad: Strengthening;Left;10 reps Knee Flexion: AROM;Left;10 reps Goniometric ROM: 8-78    General Comments        Pertinent Vitals/Pain Pain Assessment: 0-10 Pain Score: 4  Pain Location: lt knee Pain Descriptors / Indicators: Aching Pain Intervention(s): Limited activity within patient's tolerance;Monitored during session    Home Living                      Prior Function            PT Goals (current goals can now be found in the care plan section) Acute Rehab PT Goals Patient Stated Goal: go home PT Goal Formulation: With patient Time For Goal Achievement: 04/03/15 Potential to Achieve Goals: Fair Progress towards PT goals: Progressing toward goals    Frequency  7X/week    PT Plan Current plan remains appropriate    Co-evaluation             End of Session Equipment Utilized During Treatment: Gait belt Activity Tolerance: Patient tolerated treatment well Patient left: with call bell/phone within reach;in chair;with family/visitor present;Other (comment) (in knee extension)  Time: 1327-1400 PT Time Calculation (min) (ACUTE ONLY): 33 min  Charges:  $Therapeutic Exercise: 23-37 mins                    G Codes:      Christiane HaBenjamin J. Jayliani Wanner, PT, CSCS Pager [phone] Office [phone]  03/28/2015, 4:33 PM

## 2015-03-28 NOTE — Discharge Summary (Signed)
Patient ID: Amy ContrasVirginia Renwick MRN: [id number] DOB/AGE: 07/24/1940 75 y.o.  Admit date: 03/26/2015 Discharge date: 03/28/2015  Admission Diagnoses:  Active Problems:   Status post total left knee replacement   Discharge Diagnoses:  Active Problems:   Status post total left knee replacement  status post Procedure(s): TOTAL KNEE ARTHROPLASTY  Past Medical History  Diagnosis Date  . Hypertension   . Shortness of breath   . Glaucoma     R eye only but uses gtts in both eyes  . Depression   . Anxiety   . GERD (gastroesophageal reflux disease)   . H/O hiatal hernia   . HOH (hard of hearing)   . Sleep apnea     "tried CPAP; couldn't wear it"   . Hypothyroidism   . Type II diabetes mellitus (HCC)     losing weight & careful dietary choices, no Rx (03/27/2015)  . Migraines     "stopped when my periods stopped"  . Arthritis     "back, legs, knees" (03/27/2015)    Surgeries: Procedure(s): TOTAL KNEE ARTHROPLASTY on 03/26/2015   Consultants:    Discharged Condition: Improved  Hospital Course: Amy Curtis is an 75 y.o. female who was admitted 03/26/2015 for operative treatment of <principal problem not specified>. Patient failed conservative treatments (please see the history and physical for the specifics) and had severe unremitting pain that affects sleep, daily activities and work/hobbies. After pre-op clearance, the patient was taken to the operating room on 03/26/2015 and underwent  Procedure(s): TOTAL KNEE ARTHROPLASTY.    Patient was given perioperative antibiotics: Anti-infectives    Start     Dose/Rate Route Frequency Ordered Stop   03/26/15 2000  ceFAZolin (ANCEF) IVPB 1 g/50 mL premix     1 g 100 mL/hr over 30 Minutes Intravenous 3 times per day 03/26/15 1649 03/27/15 0645   03/26/15 1200  ceFAZolin (ANCEF) IVPB 2 g/50 mL premix     2 g 100 mL/hr over 30 Minutes Intravenous To [facility] 03/25/15 0919 03/26/15 1231       Patient was given sequential  compression devices and early ambulation to prevent DVT.   Patient benefited maximally from hospital stay and there were no complications. At the time of discharge, the patient was urinating/moving their bowels without difficulty, tolerating a regular diet, pain is controlled with oral pain medications and they have been cleared by PT/OT.   Recent vital signs: Patient Vitals for the past 24 hrs:  BP Temp Temp src Pulse Resp SpO2  03/28/15 0842 (!) 149/79 mmHg - - - - -  03/28/15 0521 (!) 159/68 mmHg 99.1 F (37.3 C) Oral 91 18 93 %  03/27/15 2009 (!) 170/68 mmHg 98.7 F (37.1 C) Oral 94 18 95 %  03/27/15 1243 (!) 156/70 mmHg - - 98 18 97 %     Recent laboratory studies:  Recent Labs  03/27/15 0439 03/28/15 0449  WBC 9.6 10.4  HGB 10.3* 10.2*  HCT 29.9* 31.0*  PLT 178 177  NA 133*  --   K 3.2*  --   CL 96*  --   CO2 28  --   BUN 12  --   CREATININE 0.68  --   GLUCOSE 175*  --   CALCIUM 9.2  --      Discharge Medications:     Medication List    STOP taking these medications        ALKA-SELTZER PLUS DAY/NIGHT Misc  Generic drug:  PE-DM-APAP & PE-Doxyl-DM-APAP  HYDROcodone-acetaminophen 10-325 MG tablet  Commonly known as:  NORCO     ibuprofen 200 MG tablet  Commonly known as:  ADVIL,MOTRIN     nabumetone 750 MG tablet  Commonly known as:  RELAFEN      TAKE these medications        aspirin 325 MG EC tablet  Take 1 tablet (325 mg total) by mouth daily with breakfast.     brimonidine 0.2 % ophthalmic solution  Commonly known as:  ALPHAGAN  Place 2 drops into both eyes 2 (two) times daily.     chlorthalidone 25 MG tablet  Commonly known as:  HYGROTON  Take 25 mg by mouth daily with breakfast.     dorzolamide 2 % ophthalmic solution  Commonly known as:  TRUSOPT  Place 1 drop into both eyes 2 (two) times daily.     latanoprost 0.005 % ophthalmic solution  Commonly known as:  XALATAN  Place 1 drop into both eyes at bedtime.     levothyroxine 25 MCG  tablet  Commonly known as:  SYNTHROID, LEVOTHROID  Take 25 mcg by mouth daily before breakfast.     lisinopril 20 MG tablet  Commonly known as:  PRINIVIL,ZESTRIL  Take 20 mg by mouth daily.     methocarbamol 500 MG tablet  Commonly known as:  ROBAXIN  Take 1 tablet (500 mg total) by mouth every 6 (six) hours as needed for muscle spasms.     omeprazole 40 MG capsule  Commonly known as:  PRILOSEC  Take 40 mg by mouth daily.     oxyCODONE-acetaminophen 5-325 MG tablet  Commonly known as:  ROXICET  Take 1 tablet by mouth every 6 (six) hours as needed for severe pain.     primidone 50 MG tablet  Commonly known as:  MYSOLINE  Take 50 mg by mouth at bedtime.     venlafaxine 75 MG tablet  Commonly known as:  EFFEXOR  Take 75-150 mg by mouth 2 (two) times daily. 2 in the morning and 1 in the evening     verapamil 180 MG CR tablet  Commonly known as:  CALAN-SR  Take 180 mg by mouth 2 (two) times daily.        Diagnostic Studies: Dg Chest 2 View  03/13/2015  CLINICAL DATA:  Preop for total knee arthroplasty. History of hypertension and sleep apnea. EXAM: CHEST  2 VIEW COMPARISON:  10/31/2013 FINDINGS: Heart, mediastinum and hila are unremarkable. Lungs are clear. No pleural effusion or pneumothorax. Bony thorax is demineralized but intact. IMPRESSION: No acute cardiopulmonary disease. Electronically Signed   By: Amie Portland M.D.   On: 03/13/2015 16:17   Dg Knee 1-2 Views Left  03/26/2015  CLINICAL DATA:  Postop from total knee replacement. EXAM: LEFT KNEE - 1-2 VIEW COMPARISON:  None. FINDINGS: Total knee arthroplasty is seen with all 3 components in expected position. No evidence of fracture or dislocation. Postoperative soft tissue gas and overlying skin staples noted. IMPRESSION: Expected postoperative appearance of total knee arthroplasty. No complication identified. Electronically Signed   By: Myles Rosenthal M.D.   On: 03/26/2015 19:14          Follow-up Information    Schedule  an appointment as soon as possible for a visit with Eldred Manges, MD.   Specialty:  Orthopedic Surgery   Why:  need return office visit 2 weeks postop.    Contact information:   [address] [place] [address] [phone]  Discharge Plan:  discharge to SNF  Disposition:     Signed: Naida Sleight for Annell Greening MD [facility] orthopedics [phone] 03/28/2015, 10:48 AM

## 2015-03-28 NOTE — Discharge Instructions (Signed)

## 2015-03-28 NOTE — Progress Notes (Signed)
Physical Therapy Treatment Patient Details Name: Amy Curtis MRN: [name] DOB: 06/13/1940 Today's Date: 03/28/2015    History of Present Illness 75 yo admitted for L TKA. PMHx: spinal stenosis, HTN, sleep apnea, glaucoma, anxiety, GERD, HOH    PT Comments    Pt presenting with improved mobility today. Able to ambulate 150 ft with rw and min guard, requiring min assist to transfer from supine to sitting. Remaining basic transfers are currently at the supervision level. Family arriving at the end of the session and confirming that they will be providing 24 hour supervision. Recommending HHPT and rw. Will have 1 more session to review HEP and answer questions as needed prior to D/C .   Follow Up Recommendations  Home health PT;Supervision for mobility/OOB     Equipment Recommendations  Rolling walker with 5" wheels    Recommendations for Other Services       Precautions / Restrictions Precautions Precautions: Knee;Fall Required Braces or Orthoses: Knee Immobilizer - Left Knee Immobilizer - Left: On except when in CPM Restrictions Weight Bearing Restrictions: Yes LLE Weight Bearing: Weight bearing as tolerated    Mobility  Bed Mobility Overal bed mobility: Needs Assistance Bed Mobility: Sit to Supine;Supine to Sit     Supine to sit: Min assist (min assist at trunk to come to sitting. ) Sit to supine: Supervision   General bed mobility comments: Bed flat, no rail  Transfers Overall transfer level: Needs assistance Equipment used: Rolling walker (2 wheeled) Transfers: Sit to/from Stand Sit to Stand: Supervision         General transfer comment: safe technique utilized  Ambulation/Gait Ambulation/Gait assistance: Hydrographic surveyorMin guard Ambulation Distance (Feet): 150 Feet Assistive device: Rolling walker (2 wheeled) Gait Pattern/deviations: Step-through pattern;Decreased step length - right;Decreased step length - left;Decreased weight shift to left Gait velocity:  decreased   General Gait Details: cues for even strides   Stairs Stairs:  (reports having a ramp at home)          Wheelchair Mobility    Modified Rankin (Stroke Patients Only)       Balance Overall balance assessment: Needs assistance Sitting-balance support: No upper extremity supported Sitting balance-[name]: Good     Standing balance support: No upper extremity supported Standing balance-[name]: Fair Standing balance comment: able to stand briefly without UE support                    Cognition Arousal/Alertness: Awake/alert Behavior During Therapy: WFL for tasks assessed/performed Overall Cognitive Status: Within Functional Limits for tasks assessed                      Exercises      General Comments        Pertinent Vitals/Pain Pain Assessment: 0-10 Pain Score: 3  Pain Location: lt knee Pain Descriptors / Indicators: Aching Pain Intervention(s): Limited activity within patient's tolerance;Monitored during session    Home Living                      Prior Function            PT Goals (current goals can now be found in the care plan section) Acute Rehab PT Goals Patient Stated Goal: go home PT Goal Formulation: With patient Time For Goal Achievement: 04/03/15 Potential to Achieve Goals: Fair Progress towards PT goals: Progressing toward goals    Frequency  7X/week    PT Plan Discharge plan needs to be updated  Co-evaluation             End of Session Equipment Utilized During Treatment: Gait belt Activity Tolerance: Patient tolerated treatment well Patient left: in chair;with call bell/phone within reach;Other (comment) (in knee extension)     Time: 4098-1191 PT Time Calculation (min) (ACUTE ONLY): 30 min  Charges:  $Gait Training: 23-37 mins                    G Codes:      Christiane Ha, PT, CSCS Pager [phone] Office [phone]  03/28/2015, 12:22 PM

## 2016-01-08 ENCOUNTER — Encounter (INDEPENDENT_AMBULATORY_CARE_PROVIDER_SITE_OTHER): Payer: Self-pay | Admitting: Orthopaedic Surgery

## 2016-01-08 ENCOUNTER — Ambulatory Visit (INDEPENDENT_AMBULATORY_CARE_PROVIDER_SITE_OTHER): Payer: Medicare PPO | Admitting: Orthopaedic Surgery

## 2016-01-08 VITALS — BP 134/74 | HR 87 | Ht 64.0 in | Wt 250.0 lb

## 2016-01-08 DIAGNOSIS — M48062 Spinal stenosis, lumbar region with neurogenic claudication: Secondary | ICD-10-CM | POA: Diagnosis not present

## 2016-01-08 DIAGNOSIS — G8929 Other chronic pain: Secondary | ICD-10-CM

## 2016-01-08 DIAGNOSIS — M25512 Pain in left shoulder: Secondary | ICD-10-CM

## 2016-01-08 DIAGNOSIS — M25511 Pain in right shoulder: Secondary | ICD-10-CM

## 2016-01-08 MED ORDER — LIDOCAINE HCL 1 % IJ SOLN
1.0000 mL | INTRAMUSCULAR | Status: AC | PRN
  Administered 2016-01-08: 1 mL

## 2016-01-08 MED ORDER — METHYLPREDNISOLONE ACETATE 40 MG/ML IJ SUSP
40.0000 mg | INTRAMUSCULAR | Status: AC | PRN
  Administered 2016-01-08: 40 mg via INTRA_ARTICULAR

## 2016-01-08 MED ORDER — BUPIVACAINE HCL 0.25 % IJ SOLN
4.0000 mL | INTRAMUSCULAR | Status: AC | PRN
  Administered 2016-01-08: 4 mL via INTRA_ARTICULAR

## 2016-01-08 NOTE — Progress Notes (Signed)
Office Visit Note   Patient: Amy Curtis           Date of Birth: 1940/04/17           MRN: [id number] Visit Date: 01/08/2016              Requested by: Marcelle SmilingKimberly D. Compton, PA-C [address] [place], [place] [id number] PCP: Osborne CascoOMPTON, KIMBERLY D., PA-C   Assessment & Plan: Visit Diagnoses:  1. Spinal stenosis, lumbar region, with neurogenic claudication   2. Spinal stenosis of lumbar region with neurogenic claudication   3. Chronic pain of both shoulders     Plan: Patient had previous rotator cuff repair on the right done elsewhere. Imaging previously showed the large recurrent tear with severe atrophy nonoperative care has been followed she's not really good candidate for a reverse total shoulder. Left shoulder become increasingly painful she uses it for husband up stays in a skilled facility due to severe Alzheimer's disease. She visits him frequently. She requested an injection in her left shoulder today which previously has worked well in the past. Previous decompression L4-5 did well then had recurrent stenosis at L3-4 and after surgery in 2015 she's not sure that that surgery really gave her significant improvement in her ambulation. Her increased BMI had severe difficulty in getting around. Total knee arthroplasty is doing very well. Left subacromial injection performed which gave her some relief. We'll follow her up again on a when necessary basis.  Follow-Up Instructions: No Follow-up on file.   Orders:  No orders of the defined types were placed in this encounter.  No orders of the defined types were placed in this encounter.     Procedures: Large Joint Inj Date/Time: 01/08/2016 12:48 PM Performed by: Eldred MangesYATES, Rayquan Amrhein C Authorized by: Annell GreeningYATES, Mace Weinberg C   Consent Given by:  Patient Indications:  Pain Location:  Shoulder Site:  L subacromial bursa Needle Size:  22 G Needle Length:  1.5 inches Ultrasound Guidance: No   Fluoroscopic Guidance: No   Arthrogram: No   Medications:  1  mL lidocaine 1 %; 40 mg methylPREDNISolone acetate 40 MG/ML; 4 mL bupivacaine 0.25 % Aspiration Attempted: No   Patient tolerance:  Patient tolerated the procedure well with no immediate complications     Clinical Data: No additional findings.   Subjective: Chief Complaint  Patient presents with  . Neck - Pain  . Left Arm - Pain    Patient is here today with complaints of neck and left arm pain and numbness in her fingers. 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 She is taking two Relafen in the morning, using icy hot, and taking Hydrocodone prn.     Review of Systems  Constitutional: Negative for chills and diaphoresis.  HENT: Negative for ear discharge, ear pain and nosebleeds.   Eyes: Negative for discharge and visual disturbance.       Patient wears glasses  Respiratory: Negative for cough, choking and shortness of breath.   Cardiovascular: Negative for chest pain and palpitations.  Gastrointestinal: Negative for abdominal distention and abdominal pain.  Endocrine: Negative for cold intolerance and heat intolerance.  Genitourinary: Negative for flank pain and hematuria.  Musculoskeletal:       Previous total knee arthroplasty doing well. Lumbar decompression 2 L3-4 and also L4-5 have been done separately. She continues to have back pain difficulty with prolonged ambulation and standing.  Skin: Negative for rash and wound.  Neurological: Negative for seizures and speech  difficulty.  Hematological: Negative for adenopathy. Does not bruise/bleed easily.  Psychiatric/Behavioral: Negative for agitation and suicidal ideas. The patient is nervous/anxious.      Objective: Vital Signs: BP 134/74   Pulse 87   Ht  (1.626 m)   Wt 250 lb (113.4 kg)   BMI 42.91 kg/m   Physical Exam  Constitutional: She is oriented to person, place, and time. She appears well-developed.  HENT:  Head: Normocephalic.  Right Ear: External ear normal.  Left Ear:  External ear normal.  Eyes: Pupils are equal, round, and reactive to light.  Neck: No tracheal deviation present. No thyromegaly present.  Cardiovascular: Normal rate.   Pulmonary/Chest: Effort normal.  Abdominal: Soft.  Neurological: She is alert and oriented to person, place, and time.  Skin: Skin is warm and dry.  Psychiatric: She has a normal mood and affect. Her behavior is normal.    Ortho Exam well-healed left total knee arthroplasty incision well-healed good range of motion good strength no swelling is noted. She does not have pain with ambulation. Negative straight leg raising 90 anterior to EHL gastrocsoleus is strong. Hip range of motion is good. Positive impingement left shoulder. Positive drop arm test on the right. Long head of the biceps is normal good elbow range of motion. No left shoulder subluxation. Negative drop arm test on the left, positive on the right shoulder. No brachial plexus tenderness. Good cervical range of motion.  Specialty Comments:  No specialty comments available.  Imaging: No results found.   PMFS History: Patient Active Problem List   Diagnosis Date Noted  . 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 Spinal stenosis, lumbar region, with neurogenic claudication 11/05/2013  . Lumbar spinal stenosis 11/05/2013   Past Medical History:  Diagnosis Date  . Anxiety   . Arthritis    "back, legs, knees" (03/27/2015)  . Depression   . GERD (gastroesophageal reflux disease)   . Glaucoma    R eye only but uses gtts in both eyes  . H/O hiatal hernia   . HOH (hard of hearing)   . Hypertension   . Hypothyroidism   . Migraines    "stopped when my periods stopped"  . Shortness of breath   . Sleep apnea    "tried CPAP; couldn't wear it"   . Type II diabetes mellitus (HCC)    losing weight & careful dietary choices, no Rx (03/27/2015)    No family history on file.  Past Surgical History:  Procedure Laterality Date  . BACK SURGERY    .  CARPAL TUNNEL RELEASE Right   . DILATION AND CURETTAGE OF UTERUS  X 2  . JOINT REPLACEMENT    . LUMBAR DISC SURGERY  2011   done in [place]  . LUMBAR LAMINECTOMY N/A 11/05/2013   Procedure: L3-4 Decompression;  Surgeon: Eldred Manges, MD;  Location: [facility] OR;  Service: Orthopedics;  Laterality: N/A;  . SHOULDER ARTHROSCOPY W/ ROTATOR CUFF REPAIR Right ?2013  . TONSILLECTOMY    . TOTAL KNEE ARTHROPLASTY Left 03/26/2015   Procedure: TOTAL KNEE ARTHROPLASTY;  Surgeon: Eldred Manges, MD;  Location: [facility] OR;  Service: Orthopedics;  Laterality: Left;  . TUBAL LIGATION     Social History   Occupational History  . Not on file.   Social History Main Topics  . Smoking status: Never Smoker  . Smokeless tobacco: Never Used  . Alcohol use No  . Drug use: No  . Sexual activity: No

## 2016-05-06 ENCOUNTER — Encounter (INDEPENDENT_AMBULATORY_CARE_PROVIDER_SITE_OTHER): Payer: Self-pay | Admitting: Orthopaedic Surgery

## 2016-05-06 ENCOUNTER — Ambulatory Visit (INDEPENDENT_AMBULATORY_CARE_PROVIDER_SITE_OTHER): Payer: Medicare PPO

## 2016-05-06 ENCOUNTER — Ambulatory Visit (INDEPENDENT_AMBULATORY_CARE_PROVIDER_SITE_OTHER): Payer: Medicare PPO | Admitting: Orthopaedic Surgery

## 2016-05-06 VITALS — BP 138/85 | HR 86 | Ht 64.0 in | Wt 245.0 lb

## 2016-05-06 DIAGNOSIS — G8929 Other chronic pain: Secondary | ICD-10-CM

## 2016-05-06 DIAGNOSIS — M5441 Lumbago with sciatica, right side: Secondary | ICD-10-CM

## 2016-05-06 NOTE — Progress Notes (Signed)
Office Visit Note   Patient: Amy Curtis           Date of Birth: [birth year]           MRN: [id number] Visit Date: 05/06/2016              Requested by: Marcelle Smiling. Compton, PA-C [address] [place], [place] [address] PCP: Osborne Casco D., PA-C   Assessment & Plan: Visit Diagnoses:  1. Chronic bilateral low back pain with right-sided sciatica     Plan: Patient is stable total knee arthroplasties and did not do any damage with her fall. She'll continue to work on strengthening continue to work on dieting use her walker to avoid falling and try to work on overall strengthening with home health physical therapy. She will follow-up if she has increasing symptoms.  Follow-Up Instructions: Return if symptoms worsen or fail to improve.   Orders:  Orders Placed This Encounter  Procedures  . XR Lumbar Spine 2-3 Views   No orders of the defined types were placed in this encounter.     Procedures: No procedures performed   Clinical Data: No additional findings.   Subjective: Chief Complaint  Patient presents with  . Lower Back - Pain    HPI patient was recently admitted to skilled nursing facility in [facility] 2 weeks ago due to right sided low back pain. She went there by EMS. She is been having home health work with her at home and getting her back to walking with her walker. 2 days ago she fell out of her chair landing on her knee and was concerned she may have damaged her knee. Patient states that she's been weak and has right-sided back pain. Knee pain when she landed on her knees. She is a mature with her walker.  Review of Systems history of L3-4 decompression October 2015. Left total knee arthroplasty 03/26/2015. She has superficial abrasion over her knee after the fall. Past for diabetes on metformin. She's been using hydrocodone 7.5 for pain. History of GERD, glasses for vision. Otherwise negative concerning her history of present  illness.  Objective: Vital Signs: BP 138/85   Pulse 86   Ht  (1.626 m)   Wt 245 lb (111.1 kg)   BMI 42.05 kg/m   Physical Exam  Constitutional: She is oriented to person, place, and time. She appears well-developed.  HENT:  Head: Normocephalic.  Right Ear: External ear normal.  Left Ear: External ear normal.  Eyes: Pupils are equal, round, and reactive to light.  Glasses for vision.  Neck: Normal range of motion. Neck supple. No tracheal deviation present. No thyromegaly present.  Cardiovascular: Normal rate.   Pulmonary/Chest: Effort normal.  Abdominal: Soft.  Nicole obesity.  Musculoskeletal:  No effusion of her knees. Superficial abrasions worse the left knee the right knee. Collateral ligaments are stable she has full extension flexion past 90. Well-healed lumbar incision. Anterior tib EHL is intact. Sensation her foot is intact. Distal pulses are present. Overall mild the quad and hamstring hip flexion mild weakness.  Neurological: She is alert and oriented to person, place, and time.  Skin: Skin is warm and dry.  Psychiatric: She has a normal mood and affect. Her behavior is normal.    Ortho Exam  Specialty Comments:  No specialty comments available.  Imaging: No results found. Regression 04/10/2015 after fall were negative for fracture. Left knee.  PMFS History: Patient Active Problem List   Diagnosis Date Noted  . Status  post total left knee replacement 03/26/2015  . Spinal stenosis, lumbar region, with neurogenic claudication 11/05/2013  . Lumbar spinal stenosis 11/05/2013   Past Medical History:  Diagnosis Date  . Anxiety   . Arthritis    "back, legs, knees" (03/27/2015)  . Depression   . GERD (gastroesophageal reflux disease)   . Glaucoma    R eye only but uses gtts in both eyes  . H/O hiatal hernia   . HOH (hard of hearing)   . Hypertension   . Hypothyroidism   . Migraines    "stopped when my periods stopped"  . Shortness of breath   .  Sleep apnea    "tried CPAP; couldn't wear it"   . Type II diabetes mellitus (HCC)    losing weight & careful dietary choices, no Rx (03/27/2015)    No family history on file.  Past Surgical History:  Procedure Laterality Date  . BACK SURGERY    . CARPAL TUNNEL RELEASE Right   . DILATION AND CURETTAGE OF UTERUS  X 2  . JOINT REPLACEMENT    . LUMBAR DISC SURGERY  2011   done in [place]  . LUMBAR LAMINECTOMY N/A 11/05/2013   Procedure: L3-4 Decompression;  Surgeon: Eldred Manges, MD;  Location: [facility] OR;  Service: Orthopedics;  Laterality: N/A;  . SHOULDER ARTHROSCOPY W/ ROTATOR CUFF REPAIR Right ?2013  . TONSILLECTOMY    . TOTAL KNEE ARTHROPLASTY Left 03/26/2015   Procedure: TOTAL KNEE ARTHROPLASTY;  Surgeon: Eldred Manges, MD;  Location: [facility] OR;  Service: Orthopedics;  Laterality: Left;  . TUBAL LIGATION     Social History   Occupational History  . Not on file.   Social History Main Topics  . Smoking status: Never Smoker  . Smokeless tobacco: Never Used  . Alcohol use No  . Drug use: No  . Sexual activity: No      sa

## 2016-06-10 ENCOUNTER — Ambulatory Visit (INDEPENDENT_AMBULATORY_CARE_PROVIDER_SITE_OTHER): Payer: Medicare PPO | Admitting: Orthopaedic Surgery

## 2016-06-10 ENCOUNTER — Encounter (INDEPENDENT_AMBULATORY_CARE_PROVIDER_SITE_OTHER): Payer: Self-pay | Admitting: Orthopaedic Surgery

## 2016-06-10 VITALS — Ht 64.0 in | Wt 245.0 lb

## 2016-06-10 DIAGNOSIS — M48061 Spinal stenosis, lumbar region without neurogenic claudication: Secondary | ICD-10-CM | POA: Diagnosis not present

## 2016-06-15 NOTE — Progress Notes (Signed)
Office Visit Note   Patient: Amy Curtis           Date of Birth: 11/25/1940           MRN: [id number] Visit Date: 06/10/2016              Requested by: Rolan Bucco., PA-C [address] [place], [place] [address] PCP: Rolan Bucco., PA-C   Assessment & Plan: Visit Diagnoses:  1. Spinal stenosis of lumbar region without neurogenic claudication     Plan: We'll obtain an MRI scan to evaluate her for the adjacent level stenosis. She has multiple level endplate degenerative changes. X-rays last month showed satisfactory postoperative changes from the L3-4 decompression without evidence of changes that would suggest discitis. Office follow-up after lumbar MRI without contrast. She did not have discectomy and would not require contrast.  Follow-Up Instructions: No Follow-up on file.   Orders:  No orders of the defined types were placed in this encounter.  No orders of the defined types were placed in this encounter.     Procedures: No procedures performed   Clinical Data: No additional findings.   Subjective: Chief Complaint  Patient presents with  . Lower Back - Pain  . Right Leg - Pain    HPI patient returns for ongoing chronic problems with back pain. She's finished home health physical therapy and states she is in worse shape than she has been before she feels like something catches in her right hip with pain in her buttocks and radiates down her leg to her knee and right ankle. It does not go to her toes. She has problems with depression states she is in a lot of pain and she cries a lot. She is ambulatory with a rolling walker. She's been on Relafen and also hydrocodone 7.5/325 one tablet 3 times a day ounces used icy hot, heating pad. She states her pain is constant since be worse with standing doesn't get relief with sitting or supine position. No associated bowel or bladder symptoms no fever chills. Previous lumbar decompression 2015 at the L3-4 level.  Review  of Systems review of systems updated positive for left total knee arthroplasty, depression, diabetes, decompression surgery for spinal stenosis L3-4. Previous tonsillectomy, D&C rotator cuff repair, carpal tunnel surgery.   Objective: Vital Signs: Ht 5\' 4"  (1.626 m)   Wt 245 lb (111.1 kg)   BMI 42.05 kg/m   Physical Exam  Constitutional: She is oriented to person, place, and time. She appears well-developed.  HENT:  Head: Normocephalic.  Right Ear: External ear normal.  Left Ear: External ear normal.  Eyes: Pupils are equal, round, and reactive to light.  Neck: No tracheal deviation present. No thyromegaly present.  Cardiovascular: Normal rate.   Pulmonary/Chest: Effort normal.  Abdominal: Soft.  Musculoskeletal:  Patient slow to get from sitting to standing she is a mature with the walker. Total knee arthroplasty incision well-healed collateral ligaments are stablesynovitis mild edema both lower extremities distal pulses are palpable. She has some pain with straight leg raising positive sciatic notch tenderness on the right and negative on the left trochanteric bursa is tender. No pain with hip range of motion. Anterior tib EHL is intact. No hip flexion weakness.  Neurological: She is alert and oriented to person, place, and time.  Skin: Skin is warm and dry.  Psychiatric: She has a normal mood and affect. Her behavior is normal.    Ortho Exam  Specialty Comments:  No specialty comments available.  Imaging: No results found.   PMFS History: Patient Active Problem List   Diagnosis Date Noted  . 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 Spinal stenosis, lumbar region, with neurogenic claudication 11/05/2013  . Lumbar spinal stenosis 11/05/2013   Past Medical History:  Diagnosis Date  . Anxiety   . Arthritis    "back, legs, knees" (03/27/2015)  . Depression   . GERD (gastroesophageal reflux disease)   . Glaucoma    R eye only but uses gtts in both eyes  .  H/O hiatal hernia   . HOH (hard of hearing)   . Hypertension   . Hypothyroidism   . Migraines    "stopped when my periods stopped"  . Shortness of breath   . Sleep apnea    "tried CPAP; couldn't wear it"   . Type II diabetes mellitus (HCC)    losing weight & careful dietary choices, no Rx (03/27/2015)    No family history on file.  Past Surgical History:  Procedure Laterality Date  . BACK SURGERY    . CARPAL TUNNEL RELEASE Right   . DILATION AND CURETTAGE OF UTERUS  X 2  . JOINT REPLACEMENT    . LUMBAR DISC SURGERY  [place]   [place]  . LUMBAR LAMINECTOMY N/A 11/05/2013   Procedure: L3-4 Decompression;  Surgeon: Eldred MangesMark C Yates, MD;  [facility];  Service: Orthopedics;  Laterality: N/A;  . SHOULDER ARTHROSCOPY W/ ROTATOR CUFF REPAIR Right ?2013  . TONSILLECTOMY    . TOTAL KNEE ARTHROPLASTY Left 03/26/2015   Procedure: TOTAL KNEE ARTHROPLASTY;  Surgeon: Eldred MangesMark C Yates, MD;  Location: [facility] OR;  Service: Orthopedics;  Laterality: Left;  . TUBAL LIGATION     Social History   Occupational History  . Not on file.   Social History Main Topics  . Smoking status: Never Smoker  . Smokeless tobacco: Never Used  . Alcohol use No  . Drug use: No  . Sexual activity: No

## 2016-06-17 ENCOUNTER — Encounter: Payer: Self-pay | Admitting: Orthopaedic Surgery

## 2016-06-21 ENCOUNTER — Telehealth (INDEPENDENT_AMBULATORY_CARE_PROVIDER_SITE_OTHER): Payer: Self-pay

## 2016-06-21 NOTE — Telephone Encounter (Signed)
This is the patient that I gave you the blue sheet on this morning.  Thanks.

## 2016-06-21 NOTE — Telephone Encounter (Signed)
Patient called stating Dr. Ophelia CharterYates had called her concerning her MRI and stated that someone would call her today.  CB# is [phone].  Thank You

## 2016-06-24 ENCOUNTER — Ambulatory Visit (INDEPENDENT_AMBULATORY_CARE_PROVIDER_SITE_OTHER): Payer: Medicare PPO | Admitting: Orthopaedic Surgery

## 2016-06-24 ENCOUNTER — Encounter (INDEPENDENT_AMBULATORY_CARE_PROVIDER_SITE_OTHER): Payer: Self-pay | Admitting: Orthopaedic Surgery

## 2016-06-24 VITALS — BP 137/72 | HR 87 | Ht 63.0 in | Wt 238.0 lb

## 2016-06-24 DIAGNOSIS — M48062 Spinal stenosis, lumbar region with neurogenic claudication: Secondary | ICD-10-CM | POA: Diagnosis not present

## 2016-06-30 ENCOUNTER — Other Ambulatory Visit (INDEPENDENT_AMBULATORY_CARE_PROVIDER_SITE_OTHER): Payer: Self-pay | Admitting: Orthopaedic Surgery

## 2016-06-30 ENCOUNTER — Encounter (INDEPENDENT_AMBULATORY_CARE_PROVIDER_SITE_OTHER): Payer: Self-pay | Admitting: Orthopaedic Surgery

## 2016-06-30 DIAGNOSIS — M5126 Other intervertebral disc displacement, lumbar region: Secondary | ICD-10-CM

## 2016-07-02 ENCOUNTER — Encounter (HOSPITAL_COMMUNITY): Payer: Self-pay | Admitting: *Deleted

## 2016-07-02 MED ORDER — CEFAZOLIN SODIUM-DEXTROSE 2-4 GM/100ML-% IV SOLN
2.0000 g | INTRAVENOUS | Status: AC
  Administered 2016-07-05: 2 g via INTRAVENOUS
  Filled 2016-07-02: qty 100

## 2016-07-02 NOTE — Progress Notes (Signed)
Pt denies SOB, chest pain, and being under the care of a cardiologist. Pt stated that a stress test was performed > 10 years ago but denies having an echo and cardiac cath. Pt stated that an E[facility] was [facility]; records requested. Pt denies having a chest x ray within the last year. Requested A1c result from PCP, Gwenyth BouillonBritney Villon, NP. Pt made aware to stop taking  Aspirin, vitamins, fish oil and herbal medications. Do not take any NSAIDs ie: Ibuprofen, Advil, Naproxen, Relafen BC and Goody Powder. Pt made aware to not take Metformin on DOS. Pt made aware to check BG every 2 hours prior to arrival to hospital on DOS. Pt made aware to treat a BG < 70 with  or 4 ounces of apple juice, wait 15 minutes after intervention to recheck BG, if BG remains < 70, call Short Stay unit to speak with a nurse. Pt verbalized understanding of all pre-op instructions.

## 2016-07-04 NOTE — Progress Notes (Signed)
Office Visit Note   Patient: Amy Curtis           Date of Birth: 1941-01-13           MRN: [id number] Visit Date: 06/24/2016              Requested by: Rolan Bucco., PA-C [address] [place], [place] [address] PCP: Rolan Bucco., PA-C   Assessment & Plan: Visit Diagnoses:  1. Spinal stenosis, lumbar region, with neurogenic claudication     Plan: Patient's having severe uncontrolled pain. MRI shows severe stenosis with a very large disc extrusion causing severe compression. Plan will be decompression at the L4-5 level with discectomy. Risks of surgery discussed including the potential for progression of other levels, repeat disc herniation dural tear renal failure anesthetic, patient's heart attack and death. Her pain is severe she cannot ambulate and states she would like to proceed. Questions were elicited and answered.  Follow-Up Instructions: No Follow-up on file.   Orders:  No orders of the defined types were placed in this encounter.  No orders of the defined types were placed in this encounter.     Procedures: No procedures performed   Clinical Data: No additional findings.   Subjective: Chief Complaint  Patient presents with  . Lower Back - Follow-up, Pain    HPI 76 year old female returns with ongoing persistent back pain difficulty walking and right severe leg pain. She's had the MRI which is available for review. She's had previous L3-4 decompression done in 2015 which did well up until about 6 months ago when she set progressive increased pain which she rates as severe incapacitating unable to tolerate.  Review of Systems 14 point review of systems positive for left total knee arthroplasty history of depression. Diabetes, previous lumbar decompression L3-4 on 11/05/2013. Previous tonsillectomy. D&C. Rotator cuff repair and carpal tunnel release. Positive for anxiety. Otherwise negative as it pertains to history of present  illness.   Objective: Vital Signs: BP 137/72   Pulse 87   Ht 5\' 3"  (1.6 m)   Wt 238 lb (108 kg)   BMI 42.16 kg/m   Physical Exam  Constitutional: She is oriented to person, place, and time. She appears well-developed.  HENT:  Head: Normocephalic.  Right Ear: External ear normal.  Left Ear: External ear normal.  Eyes: Pupils are equal, round, and reactive to light.  Neck: No tracheal deviation present. No thyromegaly present.  Cardiovascular: Normal rate.   Pulmonary/Chest: Effort normal.  Abdominal: Soft.  Neurological: She is alert and oriented to person, place, and time.  Skin: Skin is warm and dry.  Psychiatric: She has a normal mood and affect. Her behavior is normal.   patient's low from getting sitting standing she is ambulatory with a walker she can walk a few steps without the walker. Incision is well-healed. She ambulates for flexed position. Positive straight leg raising on the right at 60. Intact distal pulses. Anterior tube gastrocsoleus is intact. The pain with hip range of motion. Knee incisions well healed.  Ortho Exam  Specialty Comments:  No specialty comments available.  Imaging: Lumbar MRI with and without contrast 06/17/2016 shows large right subarticular disc extrusion L4-5 which causes severe central stenosis effacing the right lateral recess displacing the descending nerve roots with moderate right and severe left neural foraminal stenosis. Previous decompression at L3-4 with wide patency. Some minor foraminal narrowing remains at this level. 5 one level is normal. Portion of the large disc extrusion L4-5 also  extends down the L5-S1 disc level.   PMFS History: Patient Active Problem List   Diagnosis Date Noted  . 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 Spinal stenosis, lumbar region, with neurogenic claudication 11/05/2013  . Lumbar spinal stenosis 11/05/2013   Past Medical History:  Diagnosis Date  . Anxiety   . Arthritis     "back, legs, knees" (03/27/2015)  . Cataract    left eye  . Depression   . GERD (gastroesophageal reflux disease)   . Glaucoma    R eye only but uses gtts in both eyes  . H/O hiatal hernia   . HNP (herniated nucleus pulposus)    right lumbar 4-5  . HOH (hard of hearing)   . Hypertension   . Hypothyroidism   . Migraines    "stopped when my periods stopped"  . PONV (postoperative nausea and vomiting)   . Shortness of breath   . Sleep apnea    "tried CPAP; couldn't wear it"   . Type II diabetes mellitus (HCC)    losing weight & careful dietary choices, no Rx (03/27/2015)  . Wears dentures   . Wears glasses     Family History  Problem Relation Age of Onset  . Lung disease Mother   . Lung disease Father     Past Surgical History:  Procedure Laterality Date  . BACK SURGERY    . CARPAL TUNNEL RELEASE Right   . CATARACT EXTRACTION    . COLONOSCOPY    . DILATION AND CURETTAGE OF UTERUS  X 2  . JOINT REPLACEMENT    . [place]UMBAR DISC SURGERY  [place]   [place]  . LUMBAR LAMINECTOMY N/A 11/05/2013   Procedure: L3-4 Decompression;  Surgeon: Eldred MangesMark C Yates, MD;  [facility];  Service: Orthopedics;  Laterality: N/A;  . MULTIPLE TOOTH EXTRACTIONS    . SHOULDER ARTHROSCOPY W/ ROTATOR CUFF REPAIR Right ?2013  . TONSILLECTOMY    . TOTAL KNEE ARTHROPLASTY Left 03/26/2015   Procedure: TOTAL KNEE ARTHROPLASTY;  Surgeon: Eldred MangesMark C Yates, MD;  Location: [facility] OR;  Service: Orthopedics;  Laterality: Left;  . TUBAL LIGATION     Social History   Occupational History  . Not on file.   Social History Main Topics  . Smoking status: Never Smoker  . Smokeless tobacco: Never Used  . Alcohol use No  . Drug use: No  . Sexual activity: No

## 2016-07-05 ENCOUNTER — Encounter (HOSPITAL_COMMUNITY): Payer: Self-pay | Admitting: *Deleted

## 2016-07-05 ENCOUNTER — Ambulatory Visit (HOSPITAL_COMMUNITY): Payer: Medicare PPO

## 2016-07-05 ENCOUNTER — Ambulatory Visit (HOSPITAL_COMMUNITY): Payer: Medicare PPO | Admitting: Certified Registered Nurse Anesthetist

## 2016-07-05 ENCOUNTER — Observation Stay (HOSPITAL_COMMUNITY)
Admission: RE | Admit: 2016-07-05 | Discharge: 2016-07-06 | Disposition: A | Discharge status: Home / Self Care | Payer: Medicare PPO | Source: Ambulatory Visit | Attending: Orthopaedic Surgery | Admitting: Orthopaedic Surgery

## 2016-07-05 ENCOUNTER — Encounter (HOSPITAL_COMMUNITY)
Admission: RE | Disposition: A | Discharge status: Home / Self Care | Payer: Self-pay | Source: Ambulatory Visit | Attending: Orthopaedic Surgery

## 2016-07-05 DIAGNOSIS — E119 Type 2 diabetes mellitus without complications: Secondary | ICD-10-CM | POA: Diagnosis not present

## 2016-07-05 DIAGNOSIS — Z96652 Presence of left artificial knee joint: Secondary | ICD-10-CM | POA: Insufficient documentation

## 2016-07-05 DIAGNOSIS — G43909 Migraine, unspecified, not intractable, without status migrainosus: Secondary | ICD-10-CM | POA: Diagnosis not present

## 2016-07-05 DIAGNOSIS — M170 Bilateral primary osteoarthritis of knee: Secondary | ICD-10-CM | POA: Diagnosis not present

## 2016-07-05 DIAGNOSIS — H9190 Unspecified hearing loss, unspecified ear: Secondary | ICD-10-CM | POA: Insufficient documentation

## 2016-07-05 DIAGNOSIS — Z9849 Cataract extraction status, unspecified eye: Secondary | ICD-10-CM | POA: Insufficient documentation

## 2016-07-05 DIAGNOSIS — H409 Unspecified glaucoma: Secondary | ICD-10-CM | POA: Insufficient documentation

## 2016-07-05 DIAGNOSIS — K219 Gastro-esophageal reflux disease without esophagitis: Secondary | ICD-10-CM | POA: Insufficient documentation

## 2016-07-05 DIAGNOSIS — G4730 Sleep apnea, unspecified: Secondary | ICD-10-CM | POA: Diagnosis not present

## 2016-07-05 DIAGNOSIS — R0602 Shortness of breath: Secondary | ICD-10-CM | POA: Insufficient documentation

## 2016-07-05 DIAGNOSIS — K449 Diaphragmatic hernia without obstruction or gangrene: Secondary | ICD-10-CM | POA: Diagnosis not present

## 2016-07-05 DIAGNOSIS — Z91048 Other nonmedicinal substance allergy status: Secondary | ICD-10-CM | POA: Diagnosis not present

## 2016-07-05 DIAGNOSIS — M2408 Loose body, other site: Secondary | ICD-10-CM | POA: Insufficient documentation

## 2016-07-05 DIAGNOSIS — E039 Hypothyroidism, unspecified: Secondary | ICD-10-CM | POA: Diagnosis not present

## 2016-07-05 DIAGNOSIS — Z419 Encounter for procedure for purposes other than remedying health state, unspecified: Secondary | ICD-10-CM

## 2016-07-05 DIAGNOSIS — I10 Essential (primary) hypertension: Secondary | ICD-10-CM | POA: Insufficient documentation

## 2016-07-05 DIAGNOSIS — M5126 Other intervertebral disc displacement, lumbar region: Secondary | ICD-10-CM

## 2016-07-05 DIAGNOSIS — M5116 Intervertebral disc disorders with radiculopathy, lumbar region: Principal | ICD-10-CM | POA: Insufficient documentation

## 2016-07-05 DIAGNOSIS — F419 Anxiety disorder, unspecified: Secondary | ICD-10-CM | POA: Insufficient documentation

## 2016-07-05 DIAGNOSIS — M4690 Unspecified inflammatory spondylopathy, site unspecified: Secondary | ICD-10-CM | POA: Insufficient documentation

## 2016-07-05 DIAGNOSIS — F329 Major depressive disorder, single episode, unspecified: Secondary | ICD-10-CM | POA: Diagnosis not present

## 2016-07-05 DIAGNOSIS — M5106 Intervertebral disc disorders with myelopathy, lumbar region: Secondary | ICD-10-CM

## 2016-07-05 DIAGNOSIS — M5127 Other intervertebral disc displacement, lumbosacral region: Secondary | ICD-10-CM | POA: Diagnosis not present

## 2016-07-05 DIAGNOSIS — Z836 Family history of other diseases of the respiratory system: Secondary | ICD-10-CM | POA: Diagnosis not present

## 2016-07-05 DIAGNOSIS — M48061 Spinal stenosis, lumbar region without neurogenic claudication: Secondary | ICD-10-CM | POA: Diagnosis present

## 2016-07-05 HISTORY — DX: Reserved for concepts with insufficient information to code with codable children: IMO0002

## 2016-07-05 HISTORY — DX: Unspecified cataract: H26.9

## 2016-07-05 HISTORY — DX: Presence of dental prosthetic device (complete) (partial): Z97.2

## 2016-07-05 HISTORY — DX: Other specified postprocedural states: R11.2

## 2016-07-05 HISTORY — DX: Other specified postprocedural states: Z98.890

## 2016-07-05 HISTORY — DX: Presence of spectacles and contact lenses: Z97.3

## 2016-07-05 HISTORY — PX: LUMBAR LAMINECTOMY: SHX95

## 2016-07-05 LAB — COMPREHENSIVE METABOLIC PANEL
ALT: 16 U/L (ref 14–54)
AST: 22 U/L (ref 15–41)
Albumin: 4.1 g/dL (ref 3.5–5.0)
Alkaline Phosphatase: 60 U/L (ref 38–126)
Anion gap: 12 (ref 5–15)
BUN: 16 mg/dL (ref 6–20)
CHLORIDE: 100 mmol/L — AB (ref 101–111)
CO2: 27 mmol/L (ref 22–32)
CREATININE: 0.68 mg/dL (ref 0.44–1.00)
Calcium: 10.5 mg/dL — ABNORMAL HIGH (ref 8.9–10.3)
GFR calc Af Amer: >60 mL/min (ref >60)
GFR calc non Af Amer: >60 mL/min (ref >60)
Glucose, Bld: 132 mg/dL — ABNORMAL HIGH (ref 65–99)
Potassium: 3.6 mmol/L (ref 3.5–5.1)
Sodium: 139 mmol/L (ref 135–145)
Total Bilirubin: 0.5 mg/dL (ref 0.3–1.2)
Total Protein: 6.9 g/dL (ref 6.5–8.1)

## 2016-07-05 LAB — CBC
HCT: 40 % (ref 36.0–46.0)
Hemoglobin: 13.3 g/dL (ref 12.0–15.0)
MCH: 29.2 pg (ref 26.0–34.0)
MCHC: 33.3 g/dL (ref 30.0–36.0)
MCV: 87.9 fL (ref 78.0–100.0)
PLATELETS: 222 10*3/uL (ref 150–400)
RBC: 4.55 MIL/uL (ref 3.87–5.11)
RDW: 13.3 % (ref 11.5–15.5)
WBC: 8.1 10*3/uL (ref 4.0–10.5)

## 2016-07-05 LAB — GLUCOSE, CAPILLARY
GLUCOSE-CAPILLARY: 291 mg/dL — AB (ref 65–99)
Glucose-Capillary: 128 mg/dL — ABNORMAL HIGH (ref 65–99)
Glucose-Capillary: 157 mg/dL — ABNORMAL HIGH (ref 65–99)

## 2016-07-05 LAB — PROTIME-INR
INR: 0.95
Prothrombin Time: 12.7 seconds (ref 11.4–15.2)

## 2016-07-05 SURGERY — MICRODISCECTOMY LUMBAR LAMINECTOMY
Anesthesia: General | Site: Back | Laterality: Right

## 2016-07-05 MED ORDER — ONDANSETRON HCL 4 MG PO TABS: 4.0000 mg | ORAL_TABLET | Freq: Four times a day (QID) | ORAL | Status: DC | PRN

## 2016-07-05 MED ORDER — PHENYLEPHRINE 40 MCG/ML (10ML) SYRINGE FOR IV PUSH (FOR BLOOD PRESSURE SUPPORT)
PREFILLED_SYRINGE | INTRAVENOUS | Status: AC
  Filled 2016-07-05: qty 20

## 2016-07-05 MED ORDER — DULOXETINE HCL 30 MG PO CPEP
30.0000 mg | ORAL_CAPSULE | Freq: Two times a day (BID) | ORAL | Status: DC
  Administered 2016-07-05 – 2016-07-06 (×2): 30 mg via ORAL
  Filled 2016-07-05 (×2): qty 1

## 2016-07-05 MED ORDER — PHENYLEPHRINE 40 MCG/ML (10ML) SYRINGE FOR IV PUSH (FOR BLOOD PRESSURE SUPPORT)
PREFILLED_SYRINGE | INTRAVENOUS | Status: DC | PRN
  Administered 2016-07-05: 80 ug via INTRAVENOUS
  Administered 2016-07-05 (×2): 40 ug via INTRAVENOUS

## 2016-07-05 MED ORDER — CHLORTHALIDONE 25 MG PO TABS
25.0000 mg | ORAL_TABLET | Freq: Every day | ORAL | Status: DC
  Administered 2016-07-06: 25 mg via ORAL
  Filled 2016-07-05: qty 1

## 2016-07-05 MED ORDER — LATANOPROST 0.005 % OP SOLN
1.0000 [drp] | Freq: Every day | OPHTHALMIC | Status: DC
  Administered 2016-07-05: 1 [drp] via OPHTHALMIC
  Filled 2016-07-05: qty 2.5

## 2016-07-05 MED ORDER — DEXTROSE 5 % IV SOLN
500.0000 mg | Freq: Four times a day (QID) | INTRAVENOUS | Status: DC | PRN
  Filled 2016-07-05 (×3): qty 5

## 2016-07-05 MED ORDER — EPHEDRINE SULFATE 50 MG/ML IJ SOLN
INTRAMUSCULAR | Status: DC | PRN
  Administered 2016-07-05: 15 mg via INTRAVENOUS

## 2016-07-05 MED ORDER — METFORMIN HCL 500 MG PO TABS
500.0000 mg | ORAL_TABLET | Freq: Every day | ORAL | Status: DC
  Administered 2016-07-06: 500 mg via ORAL
  Filled 2016-07-05: qty 1

## 2016-07-05 MED ORDER — LIDOCAINE HCL (CARDIAC) 20 MG/ML IV SOLN
INTRAVENOUS | Status: DC | PRN
  Administered 2016-07-05: 90 mg via INTRAVENOUS

## 2016-07-05 MED ORDER — PRIMIDONE 50 MG PO TABS
50.0000 mg | ORAL_TABLET | Freq: Every day | ORAL | Status: DC
  Administered 2016-07-05: 50 mg via ORAL
  Filled 2016-07-05: qty 1

## 2016-07-05 MED ORDER — MENTHOL 3 MG MT LOZG: 1.0000 | LOZENGE | OROMUCOSAL | Status: DC | PRN

## 2016-07-05 MED ORDER — ONDANSETRON HCL 4 MG/2ML IJ SOLN
INTRAMUSCULAR | Status: AC
  Filled 2016-07-05: qty 6

## 2016-07-05 MED ORDER — THROMBIN 20000 UNITS EX SOLR
CUTANEOUS | Status: AC
  Filled 2016-07-05: qty 20000

## 2016-07-05 MED ORDER — SODIUM CHLORIDE 0.9% FLUSH
3.0000 mL | Freq: Two times a day (BID) | INTRAVENOUS | Status: DC
  Administered 2016-07-05 – 2016-07-06 (×2): 3 mL via INTRAVENOUS

## 2016-07-05 MED ORDER — POTASSIUM CHLORIDE IN NACL 20-0.45 MEQ/L-% IV SOLN
INTRAVENOUS | Status: DC
  Administered 2016-07-05: 18:00:00 via INTRAVENOUS
  Filled 2016-07-05 (×2): qty 1000

## 2016-07-05 MED ORDER — SUGAMMADEX SODIUM 500 MG/5ML IV SOLN
INTRAVENOUS | Status: AC
  Filled 2016-07-05: qty 5

## 2016-07-05 MED ORDER — BUPIVACAINE HCL (PF) 0.25 % IJ SOLN
INTRAMUSCULAR | Status: DC | PRN
  Administered 2016-07-05: 10 mL

## 2016-07-05 MED ORDER — HYDROMORPHONE HCL 1 MG/ML IJ SOLN
0.5000 mg | INTRAMUSCULAR | Status: DC | PRN
  Administered 2016-07-05: 0.5 mg via INTRAVENOUS
  Filled 2016-07-05: qty 1

## 2016-07-05 MED ORDER — ROCURONIUM BROMIDE 10 MG/ML (PF) SYRINGE
PREFILLED_SYRINGE | INTRAVENOUS | Status: AC
  Filled 2016-07-05: qty 15

## 2016-07-05 MED ORDER — ASPIRIN EC 325 MG PO TBEC
325.0000 mg | DELAYED_RELEASE_TABLET | Freq: Every day | ORAL | Status: DC
  Administered 2016-07-06: 325 mg via ORAL
  Filled 2016-07-05: qty 1

## 2016-07-05 MED ORDER — SUCCINYLCHOLINE CHLORIDE 200 MG/10ML IV SOSY
PREFILLED_SYRINGE | INTRAVENOUS | Status: AC
  Filled 2016-07-05: qty 20

## 2016-07-05 MED ORDER — CHLORHEXIDINE GLUCONATE 4 % EX LIQD: 60.0000 mL | Freq: Once | CUTANEOUS | Status: DC

## 2016-07-05 MED ORDER — ROCURONIUM BROMIDE 100 MG/10ML IV SOLN
INTRAVENOUS | Status: DC | PRN
  Administered 2016-07-05: 20 mg via INTRAVENOUS
  Administered 2016-07-05: 50 mg via INTRAVENOUS
  Administered 2016-07-05 (×3): 10 mg via INTRAVENOUS

## 2016-07-05 MED ORDER — FENTANYL CITRATE (PF) 100 MCG/2ML IJ SOLN
INTRAMUSCULAR | Status: AC
  Filled 2016-07-05: qty 2

## 2016-07-05 MED ORDER — BRIMONIDINE TARTRATE 0.2 % OP SOLN
1.0000 [drp] | Freq: Two times a day (BID) | OPHTHALMIC | Status: DC
  Administered 2016-07-05 – 2016-07-06 (×2): 1 [drp] via OPHTHALMIC
  Filled 2016-07-05: qty 5

## 2016-07-05 MED ORDER — BUPIVACAINE HCL (PF) 0.25 % IJ SOLN
INTRAMUSCULAR | Status: AC
  Filled 2016-07-05: qty 30

## 2016-07-05 MED ORDER — FENTANYL CITRATE (PF) 100 MCG/2ML IJ SOLN
INTRAMUSCULAR | Status: DC | PRN
Start: 2016-07-05 — End: 2016-07-05
  Administered 2016-07-05 (×6): 50 ug via INTRAVENOUS
  Administered 2016-07-05: 75 ug via INTRAVENOUS
  Administered 2016-07-05: 25 ug via INTRAVENOUS

## 2016-07-05 MED ORDER — DEXAMETHASONE SODIUM PHOSPHATE 10 MG/ML IJ SOLN
INTRAMUSCULAR | Status: DC | PRN
  Administered 2016-07-05: 4 mg via INTRAVENOUS

## 2016-07-05 MED ORDER — DORZOLAMIDE HCL 2 % OP SOLN
1.0000 [drp] | Freq: Two times a day (BID) | OPHTHALMIC | Status: DC
  Administered 2016-07-06: 1 [drp] via OPHTHALMIC
  Filled 2016-07-05: qty 10

## 2016-07-05 MED ORDER — METHOCARBAMOL 500 MG PO TABS
500.0000 mg | ORAL_TABLET | Freq: Four times a day (QID) | ORAL | Status: DC | PRN
  Administered 2016-07-05: 500 mg via ORAL
  Filled 2016-07-05 (×2): qty 1

## 2016-07-05 MED ORDER — DEXAMETHASONE SODIUM PHOSPHATE 10 MG/ML IJ SOLN
INTRAMUSCULAR | Status: AC
  Filled 2016-07-05: qty 2

## 2016-07-05 MED ORDER — SODIUM CHLORIDE 0.9 % IV SOLN: 250.0000 mL | INTRAVENOUS | Status: DC

## 2016-07-05 MED ORDER — SODIUM CHLORIDE 0.9% FLUSH: 3.0000 mL | INTRAVENOUS | Status: DC | PRN

## 2016-07-05 MED ORDER — 0.9 % SODIUM CHLORIDE (POUR BTL) OPTIME
TOPICAL | Status: DC | PRN
  Administered 2016-07-05: 1000 mL

## 2016-07-05 MED ORDER — PHENOL 1.4 % MT LIQD: 1.0000 | OROMUCOSAL | Status: DC | PRN

## 2016-07-05 MED ORDER — DEXTROSE 5 % IV SOLN
INTRAVENOUS | Status: DC | PRN
  Administered 2016-07-05: 20 ug/min via INTRAVENOUS

## 2016-07-05 MED ORDER — HYDROCODONE-ACETAMINOPHEN 7.5-325 MG PO TABS
1.0000 | ORAL_TABLET | ORAL | Status: DC | PRN
  Administered 2016-07-05 – 2016-07-06 (×3): 2 via ORAL
  Filled 2016-07-05 (×3): qty 2

## 2016-07-05 MED ORDER — HEMOSTATIC AGENTS (NO CHARGE) OPTIME
TOPICAL | Status: DC | PRN
  Administered 2016-07-05: 1 via TOPICAL

## 2016-07-05 MED ORDER — PANTOPRAZOLE SODIUM 40 MG PO TBEC
80.0000 mg | DELAYED_RELEASE_TABLET | Freq: Every day | ORAL | Status: DC
  Administered 2016-07-06: 80 mg via ORAL
  Filled 2016-07-05: qty 2

## 2016-07-05 MED ORDER — EPHEDRINE 5 MG/ML INJ
INTRAVENOUS | Status: AC
  Filled 2016-07-05: qty 20

## 2016-07-05 MED ORDER — FENTANYL CITRATE (PF) 250 MCG/5ML IJ SOLN
INTRAMUSCULAR | Status: AC
Start: 2016-07-05 — End: 2016-07-05
  Filled 2016-07-05: qty 5

## 2016-07-05 MED ORDER — VERAPAMIL HCL ER 180 MG PO TBCR
180.0000 mg | EXTENDED_RELEASE_TABLET | Freq: Two times a day (BID) | ORAL | Status: DC
  Administered 2016-07-05 – 2016-07-06 (×2): 180 mg via ORAL
  Filled 2016-07-05 (×2): qty 1

## 2016-07-05 MED ORDER — LISINOPRIL 20 MG PO TABS
20.0000 mg | ORAL_TABLET | Freq: Every day | ORAL | Status: DC
Start: 2016-07-05 — End: 2016-07-06
  Administered 2016-07-06: 20 mg via ORAL
  Filled 2016-07-05: qty 1

## 2016-07-05 MED ORDER — ONDANSETRON HCL 4 MG/2ML IJ SOLN
INTRAMUSCULAR | Status: DC | PRN
  Administered 2016-07-05: 4 mg via INTRAVENOUS

## 2016-07-05 MED ORDER — ACETAMINOPHEN 325 MG PO TABS: 650.0000 mg | ORAL_TABLET | ORAL | Status: DC | PRN

## 2016-07-05 MED ORDER — LACTATED RINGERS IV SOLN: INTRAVENOUS | Status: DC

## 2016-07-05 MED ORDER — ONDANSETRON HCL 4 MG/2ML IJ SOLN: 4.0000 mg | Freq: Four times a day (QID) | INTRAMUSCULAR | Status: DC | PRN

## 2016-07-05 MED ORDER — SUGAMMADEX SODIUM 200 MG/2ML IV SOLN
INTRAVENOUS | Status: DC | PRN
  Administered 2016-07-05: 220 mg via INTRAVENOUS

## 2016-07-05 MED ORDER — ACETAMINOPHEN 650 MG RE SUPP
650.0000 mg | RECTAL | Status: DC | PRN
Start: 2016-07-05 — End: 2016-07-06

## 2016-07-05 MED ORDER — FENTANYL CITRATE (PF) 250 MCG/5ML IJ SOLN
INTRAMUSCULAR | Status: AC
  Filled 2016-07-05: qty 5

## 2016-07-05 MED ORDER — FENTANYL CITRATE (PF) 100 MCG/2ML IJ SOLN
25.0000 ug | INTRAMUSCULAR | Status: DC | PRN
  Administered 2016-07-05 (×2): 50 ug via INTRAVENOUS

## 2016-07-05 MED ORDER — LACTATED RINGERS IV SOLN
INTRAVENOUS | Status: DC | PRN
  Administered 2016-07-05 (×2): via INTRAVENOUS

## 2016-07-05 MED ORDER — PROPOFOL 10 MG/ML IV BOLUS
INTRAVENOUS | Status: DC | PRN
  Administered 2016-07-05: 40 mg via INTRAVENOUS
  Administered 2016-07-05: 20 mg via INTRAVENOUS
  Administered 2016-07-05: 30 mg via INTRAVENOUS
  Administered 2016-07-05: 150 mg via INTRAVENOUS

## 2016-07-05 MED ORDER — LIDOCAINE 2% (20 MG/ML) 5 ML SYRINGE
INTRAMUSCULAR | Status: AC
  Filled 2016-07-05: qty 15

## 2016-07-05 MED ORDER — KETOROLAC TROMETHAMINE 15 MG/ML IJ SOLN
7.5000 mg | Freq: Four times a day (QID) | INTRAMUSCULAR | Status: AC
  Administered 2016-07-05 – 2016-07-06 (×4): 7.5 mg via INTRAVENOUS
  Filled 2016-07-05 (×4): qty 1

## 2016-07-05 SURGICAL SUPPLY — 48 items
BENZOIN TINCTURE PRP APPL 2/3 (GAUZE/BANDAGES/DRESSINGS) ×3 IMPLANT
BUR ROUND FLUTED 4 SOFT TCH (BURR) ×2 IMPLANT
BUR ROUND FLUTED 4MM SOFT TCH (BURR) ×1
CANISTER SUCT 3000ML PPV (MISCELLANEOUS) ×3 IMPLANT
CLOSURE WOUND 1/2 X4 (GAUZE/BANDAGES/DRESSINGS) ×1
COVER SURGICAL LIGHT HANDLE (MISCELLANEOUS) ×3 IMPLANT
DERMABOND ADVANCED (GAUZE/BANDAGES/DRESSINGS) ×2
DERMABOND ADVANCED .7 DNX12 (GAUZE/BANDAGES/DRESSINGS) ×1 IMPLANT
DRAPE MICROSCOPE LEICA (MISCELLANEOUS) ×3 IMPLANT
DRSG MEPILEX BORDER 4X4 (GAUZE/BANDAGES/DRESSINGS) ×3 IMPLANT
DRSG MEPILEX BORDER 4X8 (GAUZE/BANDAGES/DRESSINGS) IMPLANT
DURAPREP 26ML APPLICATOR (WOUND CARE) ×3 IMPLANT
DURASEAL SPINE SEALANT 3ML (MISCELLANEOUS) IMPLANT
ELECT BLADE 6.5 EXT (BLADE) ×3 IMPLANT
ELECT REM PT RETURN 9FT ADLT (ELECTROSURGICAL) ×3
ELECTRODE REM PT RTRN 9FT ADLT (ELECTROSURGICAL) ×1 IMPLANT
GAUZE SPONGE 4X4 12PLY STRL (GAUZE/BANDAGES/DRESSINGS) ×3 IMPLANT
GLOVE BIOGEL PI IND STRL 7.0 (GLOVE) ×1 IMPLANT
GLOVE BIOGEL PI IND STRL 8 (GLOVE) ×2 IMPLANT
GLOVE BIOGEL PI INDICATOR 7.0 (GLOVE) ×2
GLOVE BIOGEL PI INDICATOR 8 (GLOVE) ×4
GLOVE ORTHO TXT STRL SZ7.5 (GLOVE) ×6 IMPLANT
GLOVE SURG SS PI 6.5 STRL IVOR (GLOVE) ×3 IMPLANT
GOWN STRL REUS W/ TWL LRG LVL3 (GOWN DISPOSABLE) ×1 IMPLANT
GOWN STRL REUS W/ TWL XL LVL3 (GOWN DISPOSABLE) ×1 IMPLANT
GOWN STRL REUS W/TWL 2XL LVL3 (GOWN DISPOSABLE) ×3 IMPLANT
GOWN STRL REUS W/TWL LRG LVL3 (GOWN DISPOSABLE) ×2
GOWN STRL REUS W/TWL XL LVL3 (GOWN DISPOSABLE) ×2
KIT BASIN OR (CUSTOM PROCEDURE TRAY) ×3 IMPLANT
KIT ROOM TURNOVER OR (KITS) ×3 IMPLANT
NEEDLE SPNL 18GX3.5 QUINCKE PK (NEEDLE) ×3 IMPLANT
NS IRRIG 1000ML POUR BTL (IV SOLUTION) ×3 IMPLANT
PACK LAMINECTOMY ORTHO (CUSTOM PROCEDURE TRAY) ×3 IMPLANT
PAD ARMBOARD 7.5X6 YLW CONV (MISCELLANEOUS) ×9 IMPLANT
PATTIES SURGICAL .5 X.5 (GAUZE/BANDAGES/DRESSINGS) IMPLANT
PATTIES SURGICAL .75X.75 (GAUZE/BANDAGES/DRESSINGS) IMPLANT
STRIP CLOSURE SKIN 1/2X4 (GAUZE/BANDAGES/DRESSINGS) ×2 IMPLANT
SURGIFLO W/THROMBIN 8M KIT (HEMOSTASIS) ×3 IMPLANT
SUT VIC AB 0 CT1 27 (SUTURE) ×2
SUT VIC AB 0 CT1 27XBRD ANTBC (SUTURE) ×1 IMPLANT
SUT VIC AB 0 CTX 27 (SUTURE) ×3 IMPLANT
SUT VIC AB 2-0 CT1 27 (SUTURE) ×2
SUT VIC AB 2-0 CT1 TAPERPNT 27 (SUTURE) ×1 IMPLANT
SUT VIC AB 3-0 X1 27 (SUTURE) IMPLANT
SYR 20ML ECCENTRIC (SYRINGE) IMPLANT
TOWEL OR 17X24 6PK STRL BLUE (TOWEL DISPOSABLE) ×3 IMPLANT
TOWEL OR 17X26 10 PK STRL BLUE (TOWEL DISPOSABLE) ×3 IMPLANT
WATER STERILE IRR 1000ML POUR (IV SOLUTION) ×3 IMPLANT

## 2016-07-05 NOTE — H&P (Signed)
Amy Curtis is an 76 y.o. female.   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  Progressively worsening symptoms.  Failed conservative treatment.    Past Medical History:  Diagnosis Date  . Anxiety   . Arthritis    "back, legs, knees" (03/27/2015)  . Cataract    left eye  . Depression   . GERD (gastroesophageal reflux disease)   . Glaucoma    R eye only but uses gtts in both eyes  . H/O hiatal hernia   . HNP (herniated nucleus pulposus)    right lumbar 4-5  . HOH (hard of hearing)   . Hypertension   . Hypothyroidism   . Migraines    "stopped when my periods stopped"  . PONV (postoperative nausea and vomiting)   . Shortness of breath   . Sleep apnea    "tried CPAP; couldn't wear it"   . Type II diabetes mellitus (HCC)    losing weight & careful dietary choices, no Rx (03/27/2015)  . Wears dentures   . Wears glasses     Past Surgical History:  Procedure Laterality Date  . BACK SURGERY    . CARPAL TUNNEL RELEASE Right   . CATARACT EXTRACTION    . COLONOSCOPY    . DILATION AND CURETTAGE OF UTERUS  X 2  . JOINT REPLACEMENT    . LUMBAR DISC SURGERY  [place]   [place]  . LUMBAR LAMINECTOMY N/A 11/05/2013   Procedure: L3-4 Decompression;  Surgeon: Eldred MangesMark C Yates, MD;  [facility];  Service: Orthopedics;  Laterality: N/A;  . MULTIPLE TOOTH EXTRACTIONS    . SHOULDER ARTHROSCOPY W/ ROTATOR CUFF REPAIR Right ?2013  . TONSILLECTOMY    . TOTAL KNEE ARTHROPLASTY Left 03/26/2015   Procedure: TOTAL KNEE ARTHROPLASTY;  Surgeon: Eldred MangesMark C Yates, MD;  Location: [facility] OR;  Service: Orthopedics;  Laterality: Left;  . TUBAL LIGATION      Family History  Problem Relation Age of Onset  . Lung disease Mother   . Lung disease Father    Social History:  reports that she has never smoked. She has never used smokeless tobacco. She reports that she does not drink alcohol or use  drugs.  Allergies:  Allergies  Allergen Reactions  . Adhesive [Tape] Itching and Rash    HOSPITAL TAPE, Rash did not heal well    No prescriptions prior to admission.    No results found for this or any previous visit (from the past 48 hour(s)). No results found.  Review of Systems  Constitutional: Negative.   HENT: Negative.   Respiratory: Negative.   Cardiovascular: Negative.   Gastrointestinal: Negative.   Musculoskeletal: Positive for back pain.  Skin: Negative.   Neurological: Positive for tingling.  Psychiatric/Behavioral: Negative.     There were no vitals taken for this visit. Physical Exam  Constitutional: She is oriented to person, place, and time. She appears well-developed. No distress.  HENT:  Head: Normocephalic and atraumatic.  Eyes: EOM are normal. Pupils are equal, round, and reactive to light.  Neck: Normal range of motion.  Respiratory: Effort normal. No respiratory distress.  GI: She exhibits no distension.  Musculoskeletal:  Incision is well-healed. She ambulates for flexed position. Positive straight leg raising on the right at 60. Intact distal pulses. Anterior tube gastrocsoleus is intact. The pain with hip range of motion. Knee incisions well healed.    Neurological:  She is alert and oriented to person, place, and time.  Skin: Skin is warm and dry.  Psychiatric: She has a normal mood and affect.     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  Surgical procedure along with possible risks and complications discussed.  All questions answered.   Zonia Kief, PA-C 07/05/2016, 10:54 AM

## 2016-07-05 NOTE — Brief Op Note (Signed)
07/05/2016  4:56 PM  PATIENT:  Amy Curtis  76 y.o. 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 HERNIATED NUCLEUS   PROCEDURE:  Procedure(s): RIGHT L-4 HEMILAMINECTOMY, LAMINOTOMY L-5 S-1  (Right)  SURGEON:  Surgeon(s) and Role:    * Eldred MangesYates, Mark C, MD - Primary  PHYSICIAN ASSISTANT:James Barry Dieneswens PA-C   ASSISTANTS: none   ANESTHESIA:   local and general  EBL:  Total I/O In: 1000 [I.V.:1000] Out: 75 [Blood:75]  BLOOD ADMINISTERED:none  DRAINS: none   LOCAL MEDICATIONS USED:  MARCAINE     SPECIMEN:  No Specimen  DISPOSITION OF SPECIMEN:  N/A  COUNTS:  YES  TOURNIQUET:  * No tourniquets in log *  DICTATION: .Dragon Dictation  PLAN OF CARE: Admit for overnight observation  PATIENT DISPOSITION:  PACU - hemodynamically stable.   Delay start of Pharmacological VTE agent (>24hrs) due to surgical blood loss or risk of bleeding: yes

## 2016-07-05 NOTE — Interval H&P Note (Signed)
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 After consideration of risks, benefits and other options for treatment, the patient has consented to  Procedure(s): MICRODISCECTOMY OF RECURRENT HERNIATED NUCLEUS PULPOSUS (Right) as a surgical intervention .  The patient's history has been reviewed, patient examined, no change in status, stable for surgery.  I have reviewed the patient's chart and labs.  Questions were answered to the patient's satisfaction.     Eldred MangesMark C Edgar Corrigan

## 2016-07-05 NOTE — Op Note (Addendum)
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 ( 2nd level exposure needed to get migrated free fragments down the canal )  Surgeon: Annell GreeningMark Iver Fehrenbach M.D.  Asst. Zonia KiefJames Owens PA-C medically necessary and present for the entire procedure.  EBL see anesthetic record  Anesthesia Gen. plus Marcaine local.  Patient had severe excruciating pain with the very large disc herniation that extended from just inferior the disc space L4-5 all the way down to the disc space at L5-S1 slightly below it. Patient had previous surgery 2 in the past and had neurogenic claudication from the disc fragment which took up the greater than half the canal. She's had difficulty standing and walking moving due to the pain.  Suture after induction general anesthesia oral tracheal intubation patient placed on prone position chest rolls careful padding. Right arm which has had rotator cuff surgery was carefully positioned. It was able to get up overhead patient asleep and rolled yellow foam pads anterior the shoulder. Calf bumpers were used. Back was prepped with DuraPrep tilt incision after prepping drying squared with towels marked with a sterile skin marker Betadine Steri-Drape applied. Spinal needles were placed x-ray was taken which was difficult to visualize due to body habitus the with the weight of 240 pounds and short stature. Midline incision was made after timeout procedure antibiotics were given subperiosteal dissection out on the lamina was performed. There was extremely short distance between the facets and the spinous process. Even with the extra deep possible: Retractor using the long displaced possible the patient barely reach the lamina level. 4 mm bur was used to perform hemilaminectomy and L4 taking all of the right hemilaminectomy at L5 and the top portion of S1. Surgical  flow is used intermittently through the case. Thick chunks of ligament were removed there are multiple epidural veins that required use of paddies bipolar cautery. Fragment extended from the disc at L4-5 all along the right gutter extended all the way down past the L5-S1 disc. L5-S1 disc was not herniated. Fragment pieces were teased out underneath the nerve root and the patient ended up with two-level exposure on the right with lateral recess decompression both at the L4-5 level and also L5-S1 level. This fragments were removed and some other x-rays were taken with Nicholos Johns[place]field 4 placed as far down caudally as the hemilaminectomy was performed at the sacrum and then the top portion of the free fragment. The patient had some surgical flow placed up in the case we will several minutes it was suctioned dry operative field was acceptable and standard layer closure with #1 Vicryl 2-0 Vicryl subtendinous tissue skin closure postop dressing patient tolerated the procedure well.

## 2016-07-05 NOTE — Anesthesia Preprocedure Evaluation (Signed)
Anesthesia Evaluation  Patient identified by MRN, date of birth, ID band Patient awake    Reviewed: Allergy & Precautions, NPO status , Patient's Chart, lab work & pertinent test results  Airway Mallampati: II  TM Distance: >3 FB     Dental   Pulmonary shortness of breath, sleep apnea ,    breath sounds clear to auscultation       Cardiovascular hypertension,  Rhythm:Regular Rate:Normal     Neuro/Psych  Headaches,    GI/Hepatic hiatal hernia, GERD  ,  Endo/Other  diabetesHypothyroidism   Renal/GU      Musculoskeletal  (+) Arthritis ,   Abdominal   Peds  Hematology   Anesthesia Other Findings   Reproductive/Obstetrics                             Anesthesia Physical Anesthesia Plan  ASA: III  Anesthesia Plan: General   Post-op Pain Management:    Induction: Intravenous  PONV Risk Score and Plan: 2 and Dexamethasone, Propofol and Midazolam  Airway Management Planned: Oral ETT  Additional Equipment:   Intra-op Plan:   Post-operative Plan: Extubation in OR  Informed Consent:   Dental advisory given  Plan Discussed with: CRNA and Anesthesiologist  Anesthesia Plan Comments:         Anesthesia Quick Evaluation

## 2016-07-05 NOTE — Progress Notes (Signed)
Betadine swab completed

## 2016-07-05 NOTE — Anesthesia Procedure Notes (Signed)
Procedure Name: Intubation Date/Time: 07/05/2016 2:06 PM Performed by: Moshe Cipro, Sophie Quiles ANN Pre-anesthesia Checklist: Patient identified, Emergency Drugs available, Suction available and Patient being monitored Patient Re-evaluated:Patient Re-evaluated prior to inductionOxygen Delivery Method: Circle system utilized Preoxygenation: Pre-oxygenation with 100% oxygen Intubation Type: IV induction Ventilation: Mask ventilation without difficulty and Oral airway inserted - appropriate to patient size Laryngoscope Size: Mac and 3 Grade View: Grade I Tube type: Oral Tube size: 7.0 mm Number of attempts: 1 Airway Equipment and Method: Stylet Placement Confirmation: ETT inserted through vocal cords under direct vision,  positive ETCO2 and breath sounds checked- equal and bilateral Secured at: 22 cm Tube secured with: Tape Dental Injury: Teeth and Oropharynx as per pre-operative assessment

## 2016-07-05 NOTE — Transfer of Care (Signed)
Immediate Anesthesia Transfer of Care Note  Patient: Amy Curtis  Procedure(s) Performed: Procedure(s): RIGHT L-4 HEMILAMINECTOMY, LAMINOTOMY L-5 S-1  (Right)  Patient Location: PACU  Anesthesia Type:General  Level of Consciousness: sedated and patient cooperative  Airway & Oxygen Therapy: Patient Spontanous Breathing and Patient connected to face mask oxygen  Post-op Assessment: Report given to RN and Post -op Vital signs reviewed and stable  Post vital signs: Reviewed and stable  Last Vitals:  Vitals:   07/05/16 1251  BP: (!) 159/74  Pulse: 79  Resp: 18  Temp: 36.9 C    Last Pain:  Vitals:   07/05/16 1303  TempSrc:   PainSc: 3       Patients Stated Pain Goal: 2 (07/05/16 1303)  Complications: No apparent anesthesia complications

## 2016-07-06 ENCOUNTER — Encounter (HOSPITAL_COMMUNITY): Payer: Self-pay | Admitting: Orthopaedic Surgery

## 2016-07-06 DIAGNOSIS — M5116 Intervertebral disc disorders with radiculopathy, lumbar region: Secondary | ICD-10-CM | POA: Diagnosis not present

## 2016-07-06 LAB — CBC
HCT: 30.7 % — ABNORMAL LOW (ref 36.0–46.0)
HEMOGLOBIN: 10.1 g/dL — AB (ref 12.0–15.0)
MCH: 29.1 pg (ref 26.0–34.0)
MCHC: 32.8 g/dL (ref 30.0–36.0)
MCV: 88.7 fL (ref 78.0–100.0)
PLATELETS: 222 10*3/uL (ref 150–400)
RBC: 3.44 MIL/uL — AB (ref 3.87–5.11)
RDW: 13.1 % (ref 11.5–15.5)
WBC: 11 10*3/uL — AB (ref 4.0–10.5)

## 2016-07-06 LAB — BASIC METABOLIC PANEL
ANION GAP: 8 (ref 5–15)
BUN: 19 mg/dL (ref 6–20)
CHLORIDE: 100 mmol/L — AB (ref 101–111)
CO2: 27 mmol/L (ref 22–32)
Calcium: 9.4 mg/dL (ref 8.9–10.3)
Creatinine, Ser: 0.82 mg/dL (ref 0.44–1.00)
Glucose, Bld: 172 mg/dL — ABNORMAL HIGH (ref 65–99)
POTASSIUM: 4.3 mmol/L (ref 3.5–5.1)
SODIUM: 135 mmol/L (ref 135–145)

## 2016-07-06 MED ORDER — HYDROCODONE-ACETAMINOPHEN 7.5-325 MG PO TABS: 1.0000 | ORAL_TABLET | Freq: Four times a day (QID) | ORAL | 0 refills | Status: DC | PRN

## 2016-07-06 NOTE — Discharge Instructions (Signed)
Walk daily.  OK to shower then reapply dressing so cloths do not rub on incision. See Dr. Ophelia CharterYates in 9 [place] in the [place] [facility].

## 2016-07-06 NOTE — Progress Notes (Signed)
   Subjective: 1 Day Post-Op Procedure(s) (LRB): RIGHT L-4 HEMILAMINECTOMY, LAMINOTOMY L-5 S-1  (Right) Patient reports pain as mild and moderate.  Leg pain gone. C/O incisional pain only. Ambulatory times 2   Objective: Vital signs in last 24 hours: Temp:  [97.4 F (36.3 C)-98.5 F (36.9 C)] 98.5 F (36.9 C) (06/19 0114) Pulse Rate:  [79-109] 90 (06/19 0114) Resp:  [6-20] 20 (06/19 0114) BP: (108-159)/(49-78) 121/49 (06/19 0114) SpO2:  [92 %-100 %] 100 % (06/19 0114) Weight:  [238 lb (108 kg)] 238 lb (108 kg) (06/18 1303)  Intake/Output from previous day: 06/18 0701 - 06/19 0700 In: 2320 [P.O.:120; I.V.:2200] Out: 75 [Blood:75] Intake/Output this shift: No intake/output data recorded.   Recent Labs  07/05/16 1256 07/06/16 0305  HGB 13.3 10.1*    Recent Labs  07/05/16 1256 07/06/16 0305  WBC 8.1 11.0*  RBC 4.55 3.44*  HCT 40.0 30.7*  PLT 222 222    Recent Labs  07/05/16 1256 07/06/16 0305  NA 139 135  K 3.6 4.3  CL 100* 100*  CO2 27 27  BUN 16 19  CREATININE 0.68 0.82  GLUCOSE 132* 172*  CALCIUM 10.5* 9.4    Recent Labs  07/05/16 1256  INR 0.95    Neurologically intact Dg Chest 2 View  Result Date: 07/05/2016 CLINICAL DATA:  Preoperative examination in patient for lumbar surgery. EXAM: CHEST  2 VIEW COMPARISON:  None. FINDINGS: The lungs are clear. Heart size is normal. No pneumothorax or pleural effusion. No acute bony abnormality. IMPRESSION: No acute disease. Electronically Signed   By: Drusilla Kannerhomas  Dalessio M.D.   On: 07/05/2016 13:27   Dg Lumbar Spine 2-3 Views  Result Date: 07/05/2016 CLINICAL DATA:  Intraoperative localization prior to lower lumbar discectomy. EXAM: Operative LUMBAR SPINE - 2-3 VIEW COMPARISON:  MRI lumbar spine 06/17/2016. FINDINGS: Initial image at 1508 hours demonstrates clamps on the spinous process of L5 and at the posterior S1 level. Second image at 1510 hours demonstrates clamps on the spinous processes of L4 and L5.  IMPRESSION: L4 and L5 localized intraoperatively. Electronically Signed   By: Hulan Saashomas  Lawrence M.D.   On: 07/05/2016 17:24   Dg Lumbar Spine 2-3 Views  Result Date: 07/05/2016 CLINICAL DATA:  Intraoperative localization film in patient for lumbar surgery. EXAM: LUMBAR SPINE - 2-3 VIEW COMPARISON:  MRI lumbar spine 06/17/2016. FINDINGS: Three intraoperative views of the lumbar spine lateral projection are provided. On the first image, probes are seen directed toward the L4-5 disc interspace and inferior aspect of the L5 vertebral body. On the second image, probes are identified at the level of the inferior aspect of the L3 pedicles at approximately the level of the L4 pedicles. On the final image, probes are directed just inferior to the level of the L4 pedicles and toward the L4-5 interspace. IMPRESSION: Localization as described above. Electronically Signed   By: Drusilla Kannerhomas  Dalessio M.D.   On: 07/05/2016 14:52    Assessment/Plan: 1 Day Post-Op Procedure(s) (LRB): RIGHT L-4 HEMILAMINECTOMY, LAMINOTOMY L-5 S-1  (Right) Plan: discharge home.   Eldred MangesMark C Lindia Garms 07/06/2016, 7:52 AM

## 2016-07-06 NOTE — Care Management Note (Signed)
Case Management Note  Patient Details  Name: Amy Curtis MRN: [id number] Date of Birth: Mar 25, 1940  Subjective/Objective:   76 yr old female s/p right L4 hemilaminectomy right L5 hemilaminectomy, S1 laminotomy for recurrent HNP.                Action/Plan: Case manager spok ewith patient concerning discharge plan and DME. Patient says she has never had therapy with previous back surgeries, and it wasn't mentioned by Dr. Ophelia CharterYates. She is ready to go home. CM did send message to Dr. Ophelia CharterYates to confirm no HH. Patient has rollator and a cane at home, will have family support at discharge.     Expected Discharge Date:  07/06/16               Expected Discharge Plan:  Home/Self Care  In-House Referral:     Discharge planning Services  CM Consult, NA  Post Acute Care Choice:  NA Choice offered to:  Patient  DME Arranged:  N/A (Has rollator and cane) DME Agency:  NA  HH Arranged:  NA HH Agency:  NA  Status of Service:  Completed, signed off  If discussed at Long Length of Stay Meetings, dates discussed:    Additional Comments:  Durenda GuthrieBrady, Aubreyanna Dorrough Naomi, RN 07/06/2016, 2:11 PM

## 2016-07-06 NOTE — Evaluation (Signed)
Occupational Therapy Evaluation and Discharge Patient Details Name: Fleet ContrasVirginia Gailey MRN: [id number] DOB: Nov 03, 1940 Today's Date: 07/06/2016    History of Present Illness This 76 yo female underwent right L4 hemilaminectomy right L5 hemilaminectomy, S1 laminotomy for recurrent HNP   Clinical Impression   This 76 yo female admitted and underwent above presents to acute OT with all acute OT education completed, but am recommending follow up HHOT with initial 24 hour S/prn A. We will D/C from acute OT.     Follow Up Recommendations  Home health OT;Supervision/Assistance - 24 hour    Equipment Recommendations  None recommended by OT       Precautions / Restrictions Precautions Precautions: Back Precaution Booklet Issued: Yes (comment) Restrictions Weight Bearing Restrictions: No      Mobility Bed Mobility Overal bed mobility: Needs Assistance Bed Mobility: Sidelying to Sit;Sit to Sidelying   Sidelying to sit: Supervision     Sit to sidelying: Supervision General bed mobility comments: Pt normally brings one leg up on the bed from left side of bed and then shifts/twists around to lay on her right hip. Made pt aware of why this is not the best for her back and she verbalized understanding as well as she returned demo to coming down her her left side and staying on her left side to lay in bed. She currenty states she cannot roll onto her back due to her back being too sore to do have pressure against incisional site.  Transfers Overall transfer level: Needs assistance Equipment used: Rolling walker (2 wheeled) Transfers: Sit to/from Stand Sit to Stand: Supervision              Balance Overall balance assessment: Needs assistance Sitting-balance support: Feet supported;No upper extremity supported Sitting balance-[name]: Good     Standing balance support: No upper extremity supported;During functional activity Standing balance-[name]: Fair Standing balance  comment: standing at sink to wash hands                           ADL either performed or assessed with clinical judgement   ADL Overall ADL's : Needs assistance/impaired Eating/Feeding: Independent;Sitting   Grooming: Wash/dry hands;Supervision/safety;Standing   Upper Body Bathing: Set up;Supervision/ safety;Sitting   Lower Body Bathing: Supervison/ safety;Set up;Sit to/from stand   Upper Body Dressing : Set up;Supervision/safety;Sitting   Lower Body Dressing: Minimal assistance Lower Body Dressing Details (indicate cue type and reason): for socks, with S sit<>stand Toilet Transfer: Supervision/safety;Ambulation;Comfort height toilet;Grab bars   Toileting- Clothing Manipulation and Hygiene: Sit to/from stand;Supervision/safety Toileting - Clothing Manipulation Details (indicate cue type and reason): recommended that pt uses wet wipes for back peri care     Functional mobility during ADLs: Supervision/safety (cueing for following back precautions) General ADL Comments: Pt reports grand-daughter and great grand-daughters as well as sons(3) can A her prn with whatever she needs     Vision Baseline Vision/History: Wears glasses Wears Glasses: At all times              Pertinent Vitals/Pain Pain Assessment: 0-10 Pain Score: 2  Pain Location: incisional site Pain Descriptors / Indicators: Sore Pain Intervention(s): Limited activity within patient's tolerance;Repositioned     Hand Dominance Right   xtremity/Trunk Assessment Upper Extremity Assessment Upper Extremity Assessment: Overall WFL for tasks assessed RUE Deficits / Details: old shoulder sx--decreased AROM at shoulder. PROM WNL RUE Coordination: decreased gross motor   Lower Extremity Assessment Lower Extremity Assessment: Defer to PT  evaluation       Communication Communication Communication: No difficulties   Cognition Arousal/Alertness: Awake/alert Behavior During Therapy: WFL for tasks  assessed/performed Overall Cognitive Status: Impaired/Different from baseline Area of Impairment: Memory                     Memory: Decreased recall of precautions                        Home Living Family/patient expects to be discharged to:: Private residence Living Arrangements: Alone Available Help at Discharge: Family;Available PRN/intermittently Type of Home: Mobile home Home Access: Ramped entrance (with one small step at threshold)     Home Layout: One level     Bathroom Shower/Tub: Walk-in shower (and walk in tub)   Bathroom Toilet: Standard     Home Equipment: Environmental consultant - 4 wheels;Walker - standard;Bedside commode;Hand held shower head;Adaptive equipment;Wheelchair - Recruitment consultant Equipment: Reacher;Long-handled sponge        Prior Functioning/Environment Level of Independence: Independent                 OT Problem List: Decreased range of motion;Pain;Impaired balance (sitting and/or standing);Obesity      OT Treatment/Interventions:      OT Goals(Current goals can be found in the care plan section) Acute Rehab OT Goals Patient Stated Goal: to go home  OT Frequency:                AM-PAC PT "6 Clicks" Daily Activity     Outcome Measure Help from another person eating meals?: None Help from another person taking care of personal grooming?: A Little Help from another person toileting, which includes using toliet, bedpan, or urinal?: A Little Help from another person bathing (including washing, rinsing, drying)?: A Little Help from another person to put on and taking off regular upper body clothing?: A Little Help from another person to put on and taking off regular lower body clothing?: A Little 6 Click Score: 19   End of Session Equipment Utilized During Treatment: Gait belt;Rolling walker  Activity Tolerance: Patient tolerated treatment well Patient left: in chair;with call bell/phone within reach  OT Visit Diagnosis:  Unsteadiness on feet (R26.81);Muscle weakness (generalized) (M62.81)                Time: 0815-0900 OT Time Calculation (min): 45 min Charges:  OT General Charges $OT Visit: 1 Procedure OT Evaluation $OT Eval Moderate Complexity: 1 Procedure OT Treatments $Self Care/Home Management : 23-37 mins G-Codes: OT G-codes **NOT FOR INPATIENT CLASS** Functional Assessment Tool Used: AM-PAC 6 Clicks Daily Activity;Clinical judgement Functional Limitation: Self care Self Care Current Status (Z6109): At least 1 percent but less than 20 percent impaired, limited or restricted Self Care Goal Status (U0454): At least 1 percent but less than 20 percent impaired, limited or restricted Self Care Discharge Status [phone]): At least 1 percent but less than 20 percent impaired, limited or restricted   Ignacia Palma, OTR/L [phone] 07/06/2016

## 2016-07-06 NOTE — Progress Notes (Signed)
Discharge instructions reviewed with pt and family. RX given. All personal belongings with pt. No distress noted, no c/o made.  Family present to transport to home. Follow up appt with MD on July 15, 2016.

## 2016-07-06 NOTE — Evaluation (Signed)
Physical Therapy Evaluation Patient Details Name: Amy Curtis MRN: [id number] DOB: Dec 30, 1940 Today's Date: 07/06/2016   History of Present Illness  This 76 yo female underwent right L4 hemilaminectomy right L5 hemilaminectomy, S1 laminotomy for recurrent HNP. PHMx: 2 other back sxs, LTKR, and essential tremors  Clinical Impression  Patient is s/p above surgery resulting in the deficits listed below (see PT Problem List). Pt tolerated OOB mobility well. Patient will benefit from skilled PT to increase their independence and safety with mobility (while adhering to their precautions) to allow discharge to the venue listed below.     Follow Up Recommendations Home health PT;Supervision/Assistance - 24 hour    Equipment Recommendations  None recommended by PT    Recommendations for Other Services       Precautions / Restrictions Precautions Precautions: Back Precaution Booklet Issued: Yes (comment) Precaution Comments: pt able to recall 2/3 precautions Restrictions Weight Bearing Restrictions: No      Mobility  Bed Mobility Overal bed mobility: Needs Assistance Bed Mobility: Rolling;Sidelying to Sit Rolling: Supervision Sidelying to sit: Supervision     Sit to sidelying: Supervision General bed mobility comments: v/c's for safe technique and to adhere to precautions. labored effort to push up  Transfers Overall transfer level: Needs assistance Equipment used: Rolling walker (2 wheeled) Transfers: Sit to/from Stand Sit to Stand: Supervision         General transfer comment: v/c's for hand placement, increased time  Ambulation/Gait Ambulation/Gait assistance: Supervision Ambulation Distance (Feet): 140 Feet Assistive device: Rolling walker (2 wheeled) Gait Pattern/deviations: Step-through pattern;Decreased stride length Gait velocity: slow Gait velocity interpretation: Below normal speed for age/gender General Gait Details: v/c's to to relax shoulders and  increased bilat LE WBing  Stairs Stairs: Yes Stairs assistance: Min guard Stair Management: No rails;Step to pattern;With walker Number of Stairs: 1 (platform step to mimic home set up) General stair comments: completed x2  Wheelchair Mobility    Modified Rankin (Stroke Patients Only)       Balance Overall balance assessment: Needs assistance Sitting-balance support: Feet supported;No upper extremity supported Sitting balance-[name]: Good     Standing balance support: No upper extremity supported;During functional activity Standing balance-[name]: Fair Standing balance comment: standing at sink to wash hands                             Pertinent Vitals/Pain Pain Assessment: 0-10 Pain Score: 2  Pain Location: incisional site Pain Descriptors / Indicators: Sore Pain Intervention(s): Monitored during session    Home Living Family/patient expects to be discharged to:: Private residence Living Arrangements: Alone Available Help at Discharge: Family;Available PRN/intermittently Type of Home: Mobile home Home Access: Ramped entrance     Home Layout: One level Home Equipment: Walker - 4 wheels;Walker - standard;Bedside commode;Hand held shower head;Adaptive equipment;Wheelchair - manual      Prior Function Level of Independence: Independent               Hand Dominance   Dominant Hand: Right    Extremity/Trunk Assessment   Upper Extremity Assessment Upper Extremity Assessment: Defer to OT evaluation RUE Deficits / Details: old shoulder sx--decreased AROM at shoulder. PROM WNL RUE Coordination: decreased gross motor    Lower Extremity Assessment Lower Extremity Assessment: Generalized weakness    Cervical / Trunk Assessment Cervical / Trunk Assessment: Other exceptions Cervical / Trunk Exceptions: recent back surgery  Communication   Communication: No difficulties  Cognition Arousal/Alertness: Awake/alert Behavior During  Therapy: WFL for tasks assessed/performed Overall Cognitive Status: Impaired/Different from baseline Area of Impairment: Memory                     Memory: Decreased recall of precautions                General Comments General comments (skin integrity, edema, etc.): incision dressing  intact    Exercises     Assessment/Plan    PT Assessment Patient needs continued PT services  PT Problem List Decreased strength;Decreased activity tolerance;Decreased mobility;Decreased balance;Decreased knowledge of use of DME;Decreased safety awareness;Decreased knowledge of precautions       PT Treatment Interventions DME instruction;Gait training;Stair training;Functional mobility training;Therapeutic activities;Therapeutic exercise;Balance training;Cognitive remediation;Patient/family education    PT Goals (Current goals can be found in the Care Plan section)  Acute Rehab PT Goals Patient Stated Goal: go home today PT Goal Formulation: With patient Time For Goal Achievement: 07/13/16 Potential to Achieve Goals: Good    Frequency Min 5X/week   Barriers to discharge Decreased caregiver support pt however reports family will be with patient 24/7    Co-evaluation               AM-PAC PT "6 Clicks" Daily Activity  Outcome Measure Difficulty turning over in bed (including adjusting bedclothes, sheets and blankets)?: A Little Difficulty moving from lying on back to sitting on the side of the bed? : A Little Difficulty sitting down on and standing up from a chair with arms (e.g., wheelchair, bedside commode, etc,.)?: A Little Help needed moving to and from a bed to chair (including a wheelchair)?: A Little Help needed walking in hospital room?: A Little Help needed climbing 3-5 steps with a railing? : A Little 6 Click Score: 18    End of Session Equipment Utilized During Treatment: Gait belt Activity Tolerance: Patient tolerated treatment well Patient left: in  chair;with call bell/phone within reach Nurse Communication: Mobility status PT Visit Diagnosis: Muscle weakness (generalized) (M62.81);Difficulty in walking, not elsewhere classified (R26.2);Unsteadiness on feet (R26.81)    Time: 5284-1324 PT Time Calculation (min) (ACUTE ONLY): 15 min   Charges:   PT Evaluation $PT Eval Moderate Complexity: 1 Procedure     PT G Codes:   PT G-Codes **NOT FOR INPATIENT CLASS** Functional Assessment Tool Used: Clinical judgement Functional Limitation: Mobility: Walking and moving around Mobility: Walking and Moving Around Current Status (M0102): At least 40 percent but less than 60 percent impaired, limited or restricted Mobility: Walking and Moving Around Goal Status [phone]): At least 1 percent but less than 20 percent impaired, limited or restricted    Lewis Shock, PT, DPT Pager #: [phone] Office #: [phone]   Rozell Searing Freddy Kinne 07/06/2016, 12:35 PM

## 2016-07-10 NOTE — Anesthesia Postprocedure Evaluation (Signed)
Anesthesia Post Note  Patient: Fleet ContrasVirginia Beckett  Procedure(s) Performed: Procedure(s) (LRB): RIGHT L-4 HEMILAMINECTOMY, LAMINOTOMY L-5 S-1  (Right)     Patient location during evaluation: PACU Anesthesia Type: General Level of consciousness: awake, awake and alert and oriented Pain management: pain level controlled Respiratory status: spontaneous breathing, nonlabored ventilation and respiratory function stable Cardiovascular status: blood pressure returned to baseline Anesthetic complications: no    Last Vitals:  Vitals:   07/05/16 2117 07/06/16 0114  BP: 108/61 (!) 121/49  Pulse: (!) 101 90  Resp: 20 20  Temp: 36.5 C 36.9 C    Last Pain:  Vitals:   07/06/16 1223  TempSrc:   PainSc: 3                  Brigida Scotti COKER

## 2016-07-14 NOTE — Discharge Summary (Signed)
Patient ID: Amy Curtis MRN: [id number] DOB/AGE: 78-Apr-1942 78 y.o.  Admit date: 07/05/2016 Discharge date: 07/14/2016  Admission Diagnoses:  Active Problems:   Herniated nucleus pulposus, lumbar   Lumbar herniated disc   Discharge Diagnoses:  Active Problems:   Herniated nucleus pulposus, lumbar   Lumbar herniated disc  status post Procedure(s): RIGHT L-4 HEMILAMINECTOMY, LAMINOTOMY L-5 S-1   Past Medical History:  Diagnosis Date  . Anxiety   . Arthritis    "back, legs, knees" (03/27/2015)  . Cataract    left eye  . Depression   . GERD (gastroesophageal reflux disease)   . Glaucoma    R eye only but uses gtts in both eyes  . H/O hiatal hernia   . HNP (herniated nucleus pulposus)    right lumbar 4-5  . HOH (hard of hearing)   . Hypertension   . Hypothyroidism   . Migraines    "stopped when my periods stopped"  . PONV (postoperative nausea and vomiting)   . Shortness of breath   . Sleep apnea    "tried CPAP; couldn't wear it"   . Type II diabetes mellitus (HCC)    losing weight & careful dietary choices, no Rx (03/27/2015)  . Wears dentures   . Wears glasses     Surgeries: Procedure(s): RIGHT L-4 HEMILAMINECTOMY, LAMINOTOMY L-5 S-1  on 07/05/2016   Consultants:   Discharged Condition: Improved  Hospital Course: Amy Curtis is an 76 y.o. female who was admitted 07/05/2016 for operative treatment of lumbar stenosis. Patient failed conservative treatments (please see the history and physical for the specifics) and had severe unremitting pain that affects sleep, daily activities and work/hobbies. After pre-op clearance, the patient was taken to the operating room on 07/05/2016 and underwent  Procedure(s): RIGHT L-4 HEMILAMINECTOMY, LAMINOTOMY L-5 S-1 .    Patient was given perioperative antibiotics:  Anti-infectives    Start     Dose/Rate Route Frequency Ordered Stop   07/05/16 1430  ceFAZolin (ANCEF) IVPB 2g/100 mL premix     2 g 200 mL/hr over 30 Minutes  Intravenous To ShortStay Surgical 07/02/16 0926 07/05/16 1425       Patient was given sequential compression devices and early ambulation to prevent DVT.   Patient benefited maximally from hospital stay and there were no complications. At the time of discharge, the patient was urinating/moving their bowels without difficulty, tolerating a regular diet, pain is controlled with oral pain medications and they have been cleared by PT/OT.   Recent vital signs: No data found.    Recent laboratory studies: No results for input(s): WBC, HGB, HCT, PLT, NA, K, CL, CO2, BUN, CREATININE, GLUCOSE, INR, CALCIUM in the last 72 hours.  Invalid input(s): PT, 2   Discharge Medications:   Allergies as of 07/06/2016      Reactions   Adhesive [tape] Itching, Rash   HOSPITAL TAPE, Rash did not heal well      Medication List    STOP taking these medications   oxyCODONE-acetaminophen 5-325 MG tablet Commonly known as:  ROXICET     TAKE these medications   aspirin 325 MG EC tablet Take 1 tablet (325 mg total) by mouth daily with breakfast.   brimonidine 0.2 % ophthalmic solution Commonly known as:  ALPHAGAN Place 1 drop into the left eye 2 (two) times daily.   chlorthalidone 25 MG tablet Commonly known as:  HYGROTON Take 25 mg by mouth daily with breakfast.   dorzolamide 2 % ophthalmic solution Commonly known  as:  TRUSOPT Place 1 drop into the left eye 2 (two) times daily.   DULoxetine 30 MG capsule Commonly known as:  CYMBALTA Take 30 mg by mouth 2 (two) times daily.   HYDROcodone-acetaminophen 7.5-325 MG tablet Commonly known as:  NORCO Take 1-2 tablets by mouth every 6 (six) hours as needed (breakthrough pain). What changed:  how much to take  when to take this  reasons to take this   latanoprost 0.005 % ophthalmic solution Commonly known as:  XALATAN Place 1 drop into the left eye at bedtime.   lisinopril 20 MG tablet Commonly known as:  PRINIVIL,ZESTRIL Take 20 mg by  mouth daily.   metFORMIN 500 MG tablet Commonly known as:  GLUCOPHAGE Take 500 mg by mouth daily with breakfast.   methocarbamol 500 MG tablet Commonly known as:  ROBAXIN Take 1 tablet (500 mg total) by mouth every 6 (six) hours as needed for muscle spasms.   nabumetone 750 MG tablet Commonly known as:  RELAFEN Take 750 mg by mouth 2 (two) times daily.   omeprazole 40 MG capsule Commonly known as:  PRILOSEC Take 40 mg by mouth daily.   primidone 50 MG tablet Commonly known as:  MYSOLINE Take 50 mg by mouth at bedtime.   verapamil 180 MG CR tablet Commonly known as:  CALAN-SR Take 180 mg by mouth 2 (two) times daily.   Vitamin D 2000 units Caps Take 4,000 Units by mouth 2 (two) times daily.       Diagnostic Studies: Dg Chest 2 View  Result Date: 07/05/2016 CLINICAL DATA:  Preoperative examination in patient for lumbar surgery. EXAM: CHEST  2 VIEW COMPARISON:  None. FINDINGS: The lungs are clear. Heart size is normal. No pneumothorax or pleural effusion. No acute bony abnormality. IMPRESSION: No acute disease. Electronically Signed   By: Drusilla Kanner M.D.   On: 07/05/2016 13:27   Dg Lumbar Spine 2-3 Views  Result Date: 07/05/2016 CLINICAL DATA:  Intraoperative localization prior to lower lumbar discectomy. EXAM: Operative LUMBAR SPINE - 2-3 VIEW COMPARISON:  MRI lumbar spine 06/17/2016. FINDINGS: Initial image at 1508 hours demonstrates clamps on the spinous process of L5 and at the posterior S1 level. Second image at 1510 hours demonstrates clamps on the spinous processes of L4 and L5. IMPRESSION: L4 and L5 localized intraoperatively. Electronically Signed   By: Hulan Saas M.D.   On: 07/05/2016 17:24   Dg Lumbar Spine 2-3 Views  Result Date: 07/05/2016 CLINICAL DATA:  Intraoperative localization film in patient for lumbar surgery. EXAM: LUMBAR SPINE - 2-3 VIEW COMPARISON:  MRI lumbar spine 06/17/2016. FINDINGS: Three intraoperative views of the lumbar spine lateral  projection are provided. On the first image, probes are seen directed toward the L4-5 disc interspace and inferior aspect of the L5 vertebral body. On the second image, probes are identified at the level of the inferior aspect of the L3 pedicles at approximately the level of the L4 pedicles. On the final image, probes are directed just inferior to the level of the L4 pedicles and toward the L4-5 interspace. IMPRESSION: Localization as described above. Electronically Signed   By: Drusilla Kanner M.D.   On: 07/05/2016 14:52      Follow-up Information    Eldred Manges, MD Follow up in 9 day(s).   Specialty:  Orthopedic Surgery Contact information: [address] [place] [address] [phone]           Discharge Plan:  discharge to home Disposition:  Signed: Zonia KiefJames Owens  07/14/2016, 8:50 AM

## 2016-07-15 ENCOUNTER — Encounter (INDEPENDENT_AMBULATORY_CARE_PROVIDER_SITE_OTHER): Payer: Self-pay | Admitting: Orthopaedic Surgery

## 2016-07-15 ENCOUNTER — Ambulatory Visit (INDEPENDENT_AMBULATORY_CARE_PROVIDER_SITE_OTHER): Payer: Medicare PPO | Admitting: Orthopaedic Surgery

## 2016-07-15 VITALS — BP 137/85 | HR 89 | Ht 64.0 in | Wt 241.0 lb

## 2016-07-15 DIAGNOSIS — M5126 Other intervertebral disc displacement, lumbar region: Secondary | ICD-10-CM

## 2016-07-15 NOTE — Progress Notes (Signed)
Post-Op Visit Note   Patient: Amy Curtis           Date of Birth: 08-13-1940           MRN: [id number] Visit Date: 07/15/2016 PCP: Rolan Bucco., PA-C   Assessment & Plan:  Chief Complaint:  Chief Complaint  Patient presents with  . Lower Back - Routine Post Op   Visit Diagnoses:  1. Herniated nucleus pulposus, lumbar     Post right L4 hemilaminectomy laminotomy at L5-S1 as well with disc herniation with migrated fragment requiring two-level exposure for removal of the free fragment.  Plan: Patient's got good relief of her leg pain she still has some numbness in her calf and down in her foot. Anterior tib EHL plantar flexion dorsiflexion is strong. No swelling in her ankles. She's happy with the  surgical result and I'll recheck her in 4 weeks.  Follow-Up Instructions: Return in about 4 weeks (around 08/12/2016).   Orders:  No orders of the defined types were placed in this encounter.  No orders of the defined types were placed in this encounter.   Imaging: No results found.  PMFS History: Patient Active Problem List   Diagnosis Date Noted  . Herniated nucleus pulposus, lumbar 07/05/2016  . Lumbar herniated disc 07/05/2016  . 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 Spinal stenosis, lumbar region, with neurogenic claudication 11/05/2013  . Lumbar spinal stenosis 11/05/2013   Past Medical History:  Diagnosis Date  . Anxiety   . Arthritis    "back, legs, knees" (03/27/2015)  . Cataract    left eye  . Depression   . GERD (gastroesophageal reflux disease)   . Glaucoma    R eye only but uses gtts in both eyes  . H/O hiatal hernia   . HNP (herniated nucleus pulposus)    right lumbar 4-5  . HOH (hard of hearing)   . Hypertension   . Hypothyroidism   . Migraines    "stopped when my periods stopped"  . PONV (postoperative nausea and vomiting)   . Shortness of breath   . Sleep apnea    "tried CPAP; couldn't wear it"   . Type II diabetes  mellitus (HCC)    losing weight & careful dietary choices, no Rx (03/27/2015)  . Wears dentures   . Wears glasses     Family History  Problem Relation Age of Onset  . Lung disease Mother   . Lung disease Father     Past Surgical History:  Procedure Laterality Date  . BACK SURGERY    . CARPAL TUNNEL RELEASE Right   . CATARACT EXTRACTION    . COLONOSCOPY    . DILATION AND CURETTAGE OF UTERUS  X 2  . JOINT REPLACEMENT    . LUMBAR DISC SURGERY  2011   done in [place]  . LUMBAR LAMINECTOMY N/A 11/05/2013   Procedure: L3-4 Decompression;  Surgeon: Eldred Manges, MD;  Location: [facility] OR;  Service: Orthopedics;  Laterality: N/A;  . LUMBAR LAMINECTOMY Right 07/05/2016   Procedure: RIGHT L-4 HEMILAMINECTOMY, LAMINOTOMY L-5 S-1 ;  Surgeon: Eldred Manges, MD;  Location: [facility] OR;  Service: Orthopedics;  Laterality: Right;  . MULTIPLE TOOTH EXTRACTIONS    . SHOULDER ARTHROSCOPY W/ ROTATOR CUFF REPAIR Right ?2013  . TONSILLECTOMY    . TOTAL KNEE ARTHROPLASTY Left 03/26/2015   Procedure: TOTAL KNEE ARTHROPLASTY;  Surgeon: Eldred Manges, MD;  Location: [facility] OR;  Service: Orthopedics;  Laterality: Left;  .  TUBAL LIGATION     Social History   Occupational History  . Not on file.   Social History Main Topics  . Smoking status: Never Smoker  . Smokeless tobacco: Never Used  . Alcohol use No  . Drug use: No  . Sexual activity: No

## 2016-07-19 ENCOUNTER — Telehealth (INDEPENDENT_AMBULATORY_CARE_PROVIDER_SITE_OTHER): Payer: Self-pay

## 2016-07-19 NOTE — Telephone Encounter (Signed)
FYI- [facility] was calling to let us know that patient did not get Rx filled that was prescribed by Dr. Ophelia CharterYates on 07/06/16.  Stated that they were going to shred that Rx and Prescribe her a new Rx from there office.

## 2016-08-12 ENCOUNTER — Ambulatory Visit (INDEPENDENT_AMBULATORY_CARE_PROVIDER_SITE_OTHER): Payer: Medicare PPO | Admitting: Orthopaedic Surgery

## 2016-08-12 ENCOUNTER — Encounter (INDEPENDENT_AMBULATORY_CARE_PROVIDER_SITE_OTHER): Payer: Self-pay | Admitting: Orthopaedic Surgery

## 2016-08-12 VITALS — BP 157/83 | HR 87 | Ht 64.0 in | Wt 241.0 lb

## 2016-08-12 DIAGNOSIS — M48062 Spinal stenosis, lumbar region with neurogenic claudication: Secondary | ICD-10-CM

## 2016-08-12 NOTE — Progress Notes (Signed)
Office Visit Note   Patient: Amy Curtis           Date of Birth: 10/07/1940           MRN: [id number] Visit Date: 08/12/2016              Requested by: Rolan Buccoompton, Kimberly D., PA-C [address] [place], [place] [phone] PCP: Rolan Buccoompton, Kimberly D., PA-C   Assessment & Plan: Visit Diagnoses:  1. Spinal stenosis, lumbar region, with neurogenic claudication     Plan: Patient separately with the surgical result and relief of pain and will return on a when necessary basis. She'll work on weight loss and ambulation to strengthen her legs and improve her endurance.  Follow-Up Instructions: No Follow-up on file.   Orders:  No orders of the defined types were placed in this encounter.  No orders of the defined types were placed in this encounter.     Procedures: No procedures performed   Clinical Data: No additional findings.   Subjective: Chief Complaint  Patient presents with  . Lower Back - Routine Post Op    HPI 76 year old female returns post lumbar surgery with L4 hemilaminectomy and right L5 laminotomy. She states she is doing much better. Incision looks good she notices that her legs feel like jelly sometimes when she walks and she is gradually working on increasing her ambulation. She can resume her, by or stationary bike. She'll gradually work on some gradual weight loss with some dieting gradually increase her walking to increase her strength and endurance. I can check her back again on a when necessary basis.  Review of Systems review of systems unchanged from surgery 6 weeks ago   Objective: Vital Signs: BP (!) 157/83   Pulse 87   Ht 5\' 4"  (1.626 m)   Wt 241 lb (109.3 kg)   BMI 41.37 kg/m   Physical Exam patient's incisions well-healed. She is a mature with the cane should gradually work on endurance and strengthening.  Ortho Exam  Specialty Comments:  No specialty comments available.  Imaging: No results found.   PMFS History: Patient Active Problem  List   Diagnosis Date Noted  . Herniated nucleus pulposus, lumbar 07/05/2016  . Lumbar herniated disc 07/05/2016  . 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 Spinal stenosis, lumbar region, with neurogenic claudication 11/05/2013  . Lumbar spinal stenosis 11/05/2013   Past Medical History:  Diagnosis Date  . Anxiety   . Arthritis    "back, legs, knees" (03/27/2015)  . Cataract    left eye  . Depression   . GERD (gastroesophageal reflux disease)   . Glaucoma    R eye only but uses gtts in both eyes  . H/O hiatal hernia   . HNP (herniated nucleus pulposus)    right lumbar 4-5  . HOH (hard of hearing)   . Hypertension   . Hypothyroidism   . Migraines    "stopped when my periods stopped"  . PONV (postoperative nausea and vomiting)   . Shortness of breath   . Sleep apnea    "tried CPAP; couldn't wear it"   . Type II diabetes mellitus (HCC)    losing weight & careful dietary choices, no Rx (03/27/2015)  . Wears dentures   . Wears glasses     Family History  Problem Relation Age of Onset  . Lung disease Mother   . Lung disease Father     Past Surgical History:  Procedure Laterality Date  .  BACK SURGERY    . CARPAL TUNNEL RELEASE Right   . CATARACT EXTRACTION    . COLONOSCOPY    . DILATION AND CURETTAGE OF UTERUS  X 2  . JOINT REPLACEMENT    . L[place]BAR DISC SURG[place]  [place]  . LUMBAR LAMINECTOMY N/A 11/05/2013   Procedure: L3-4 Decompression;  Surgeon: Eldred MangesMark C Kristle Wesch, MD;  [facility];  Service: Orthopedics;  Laterality: N/A;  . LUMBAR LAMINECTOMY Right 07/05/2016   Procedure: RIGHT L-4 HEMILAMINECTOMY, LAMINOTOMY L-5 S-1 ;  Surgeon: Eldred MangesYates, Secilia Apps C, MD;  Location: [facility] OR;  Service: Orthopedics;  Laterality: Right;  . MULTIPLE TOOTH EXTRACTIONS    . SHOULDER ARTHROSCOPY W/ ROTATOR CUFF REPAIR Right ?2013  . TONSILLECTOMY    . TOTAL KNEE ARTHROPLASTY Left 03/26/2015   Procedure: TOTAL KNEE ARTHROPLASTY;  Surgeon: Eldred MangesMark C Shaka Zech, MD;  Location: [facility] OR;   Service: Orthopedics;  Laterality: Left;  . TUBAL LIGATION     Social History   Occupational History  . Not on file.   Social History Main Topics  . Smoking status: Never Smoker  . Smokeless tobacco: Never Used  . Alcohol use No  . Drug use: No  . Sexual activity: No

## 2018-04-23 IMAGING — CR DG LUMBAR SPINE 2-3V
2 series · 2 of 2 positions shown · non-contrast
Comparison: MRI lumbar spine 06/17/2016.

CLINICAL DATA: Intraoperative localization prior to lower lumbar
discectomy.

EXAM:
Operative LUMBAR SPINE - 2-3 VIEW

[xtable lateral (1 of 2)]
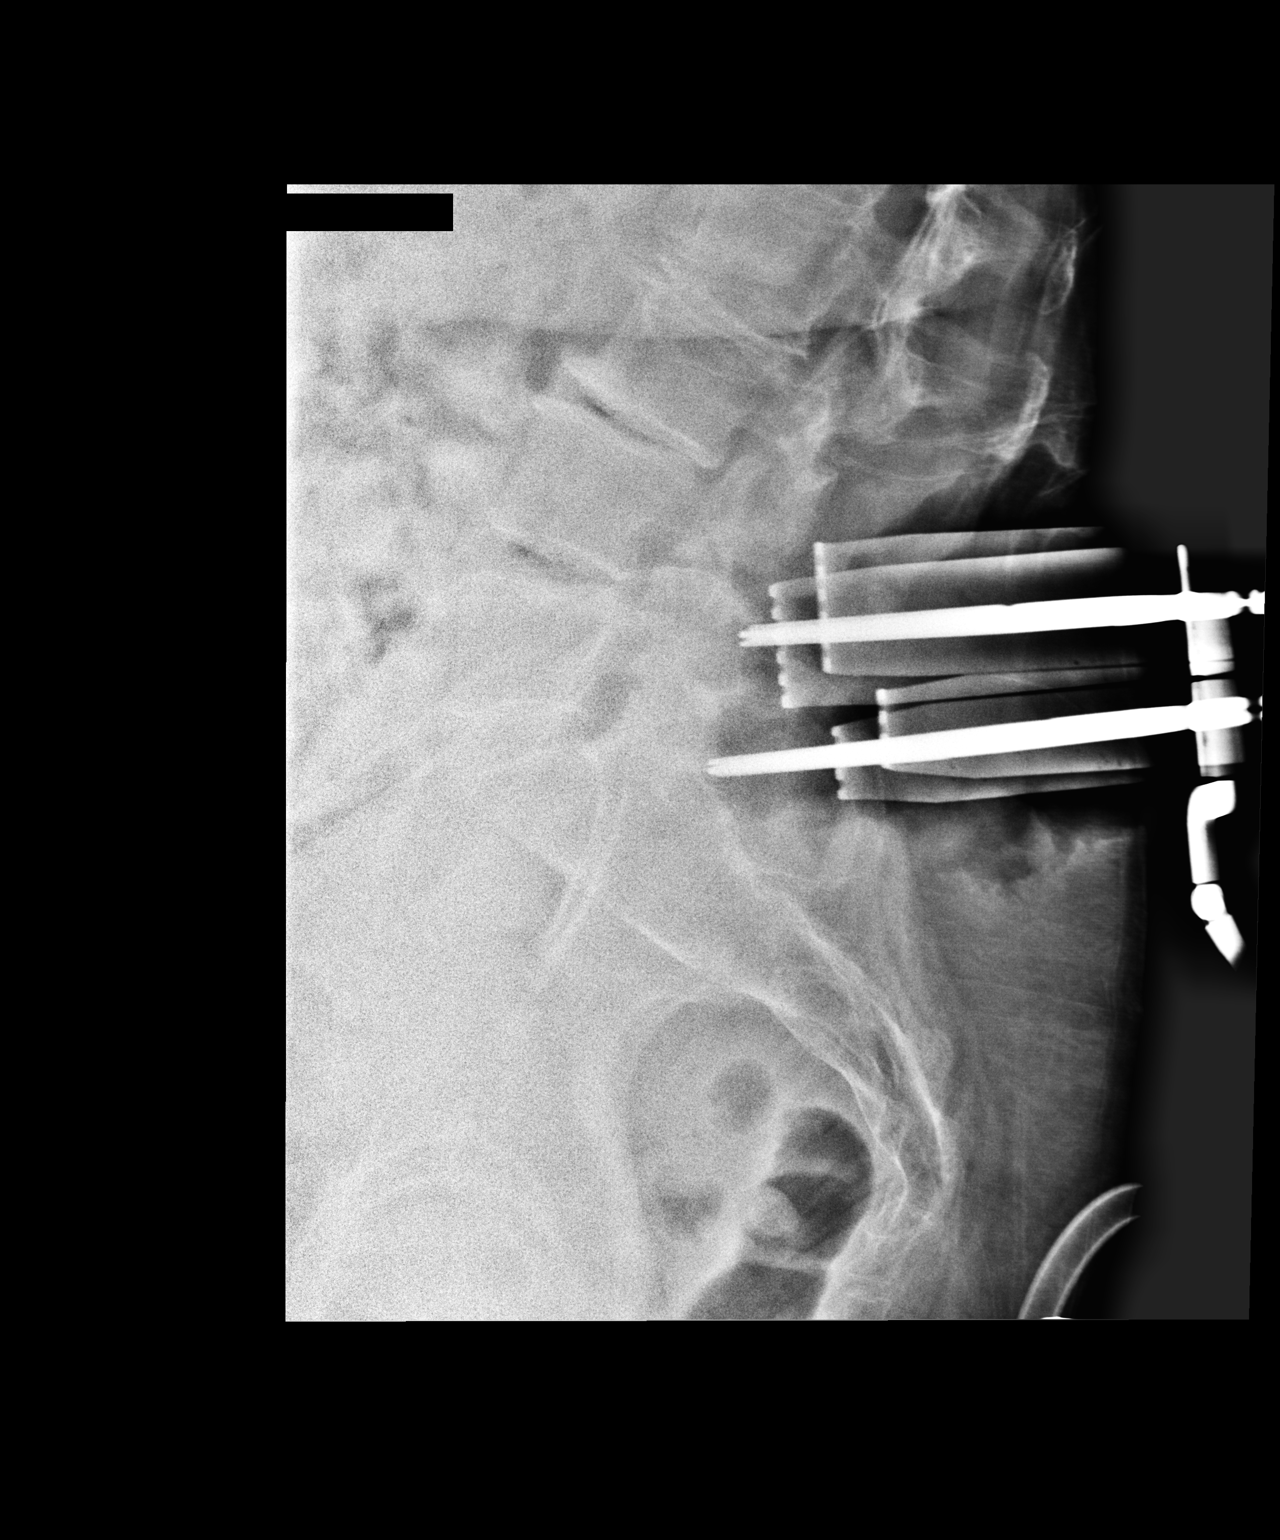

[xtable lateral (2 of 2)]
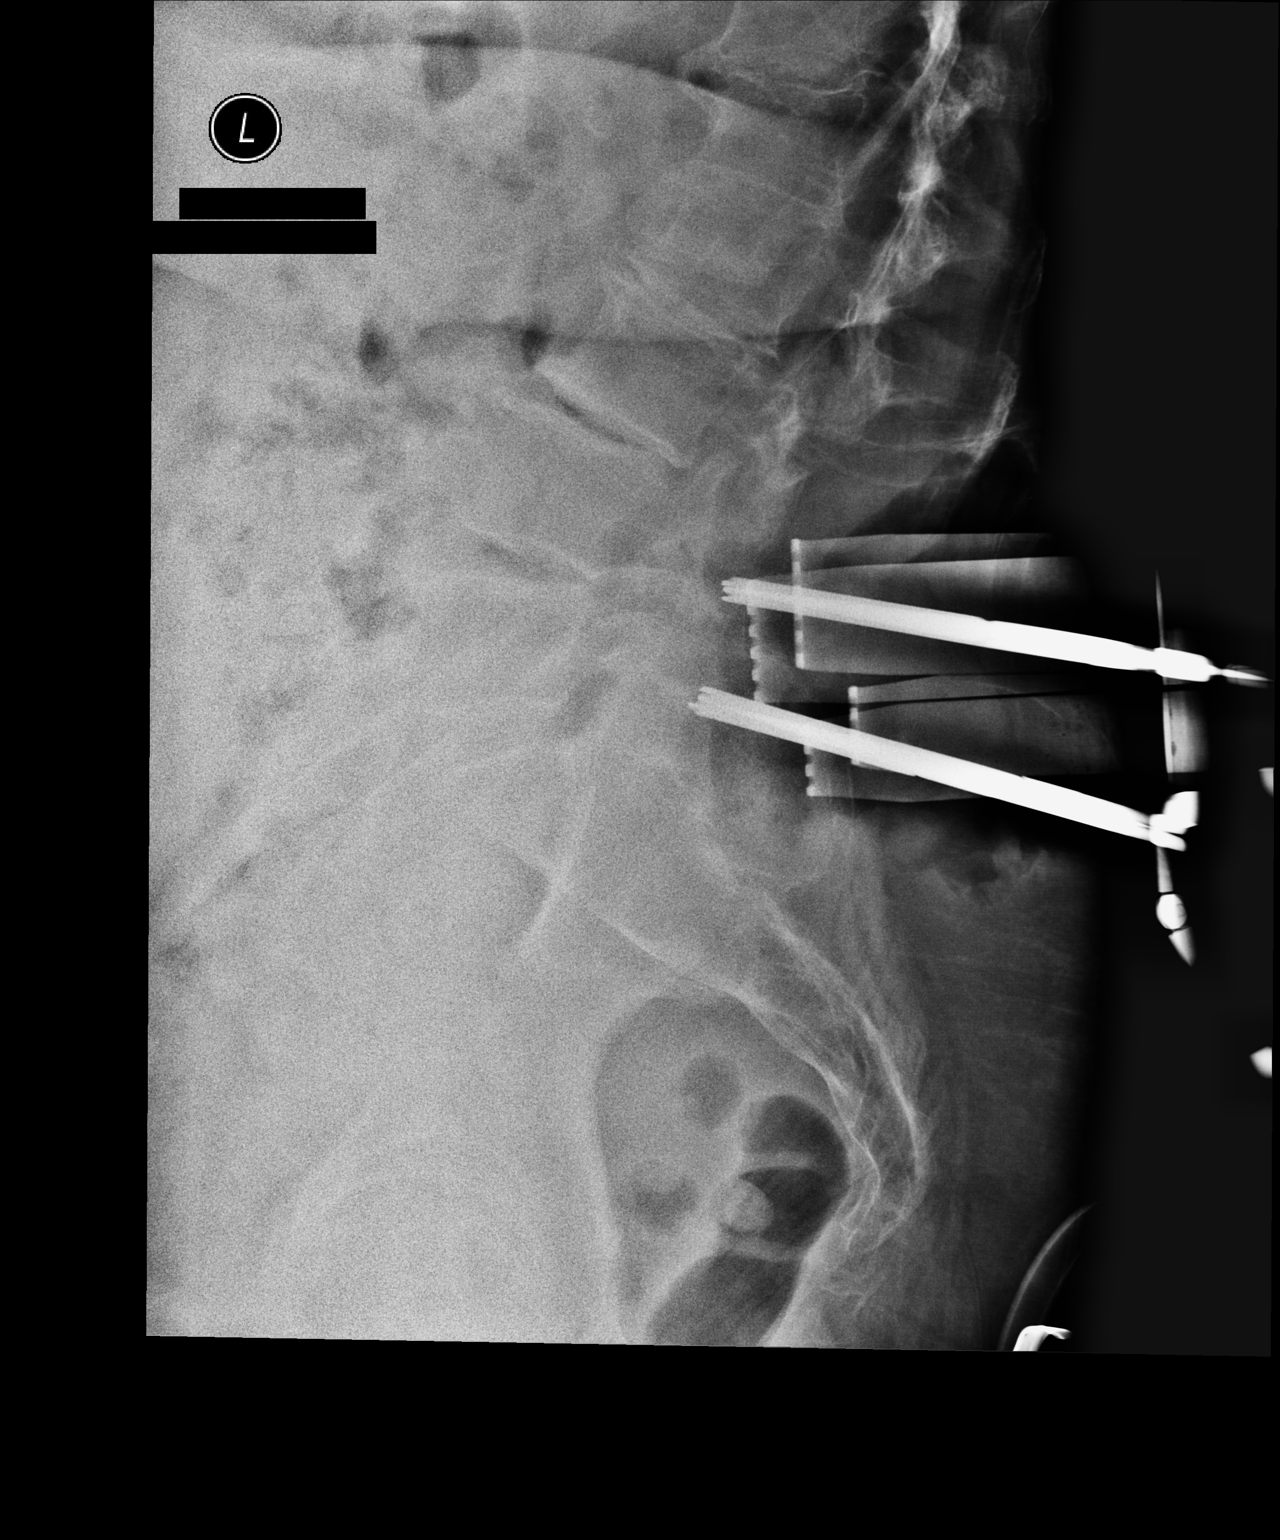

[2 of 2 positions shown; findings below may reference images not displayed]

FINDINGS: Initial image at 2316 hours demonstrates clamps on the spinous
process of L5 and at the posterior S1 level. Second image at 3938
hours demonstrates clamps on the spinous processes of L4 and L5.
IMPRESSION: L4 and L5 localized intraoperatively.

## 2018-04-23 IMAGING — CR DG CHEST 2V
2 series · 2 of 2 positions shown · non-contrast
Comparison: None.

CLINICAL DATA: Preoperative examination in patient for lumbar
surgery.

EXAM:
CHEST  2 VIEW

[w chest lat]
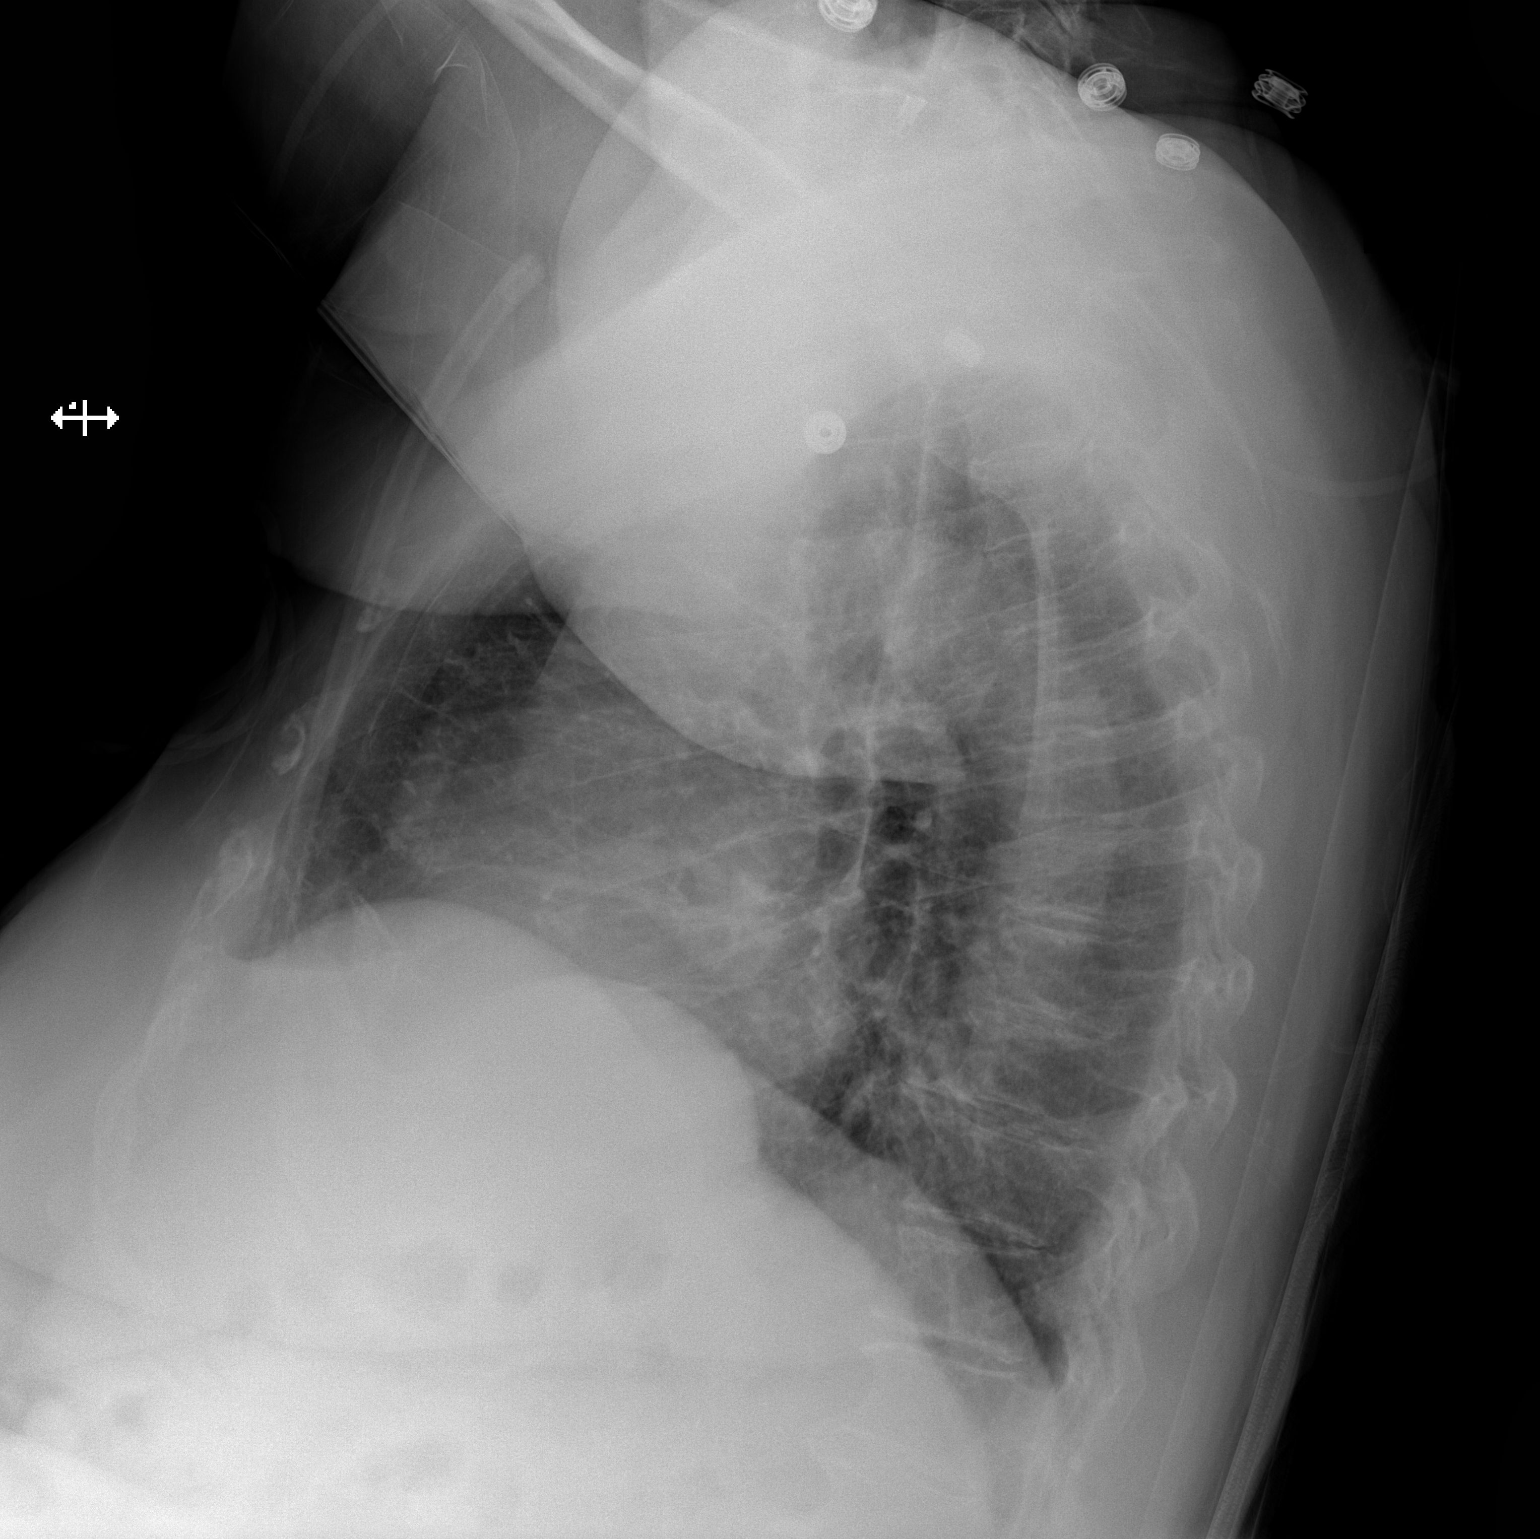

[x chest ap]
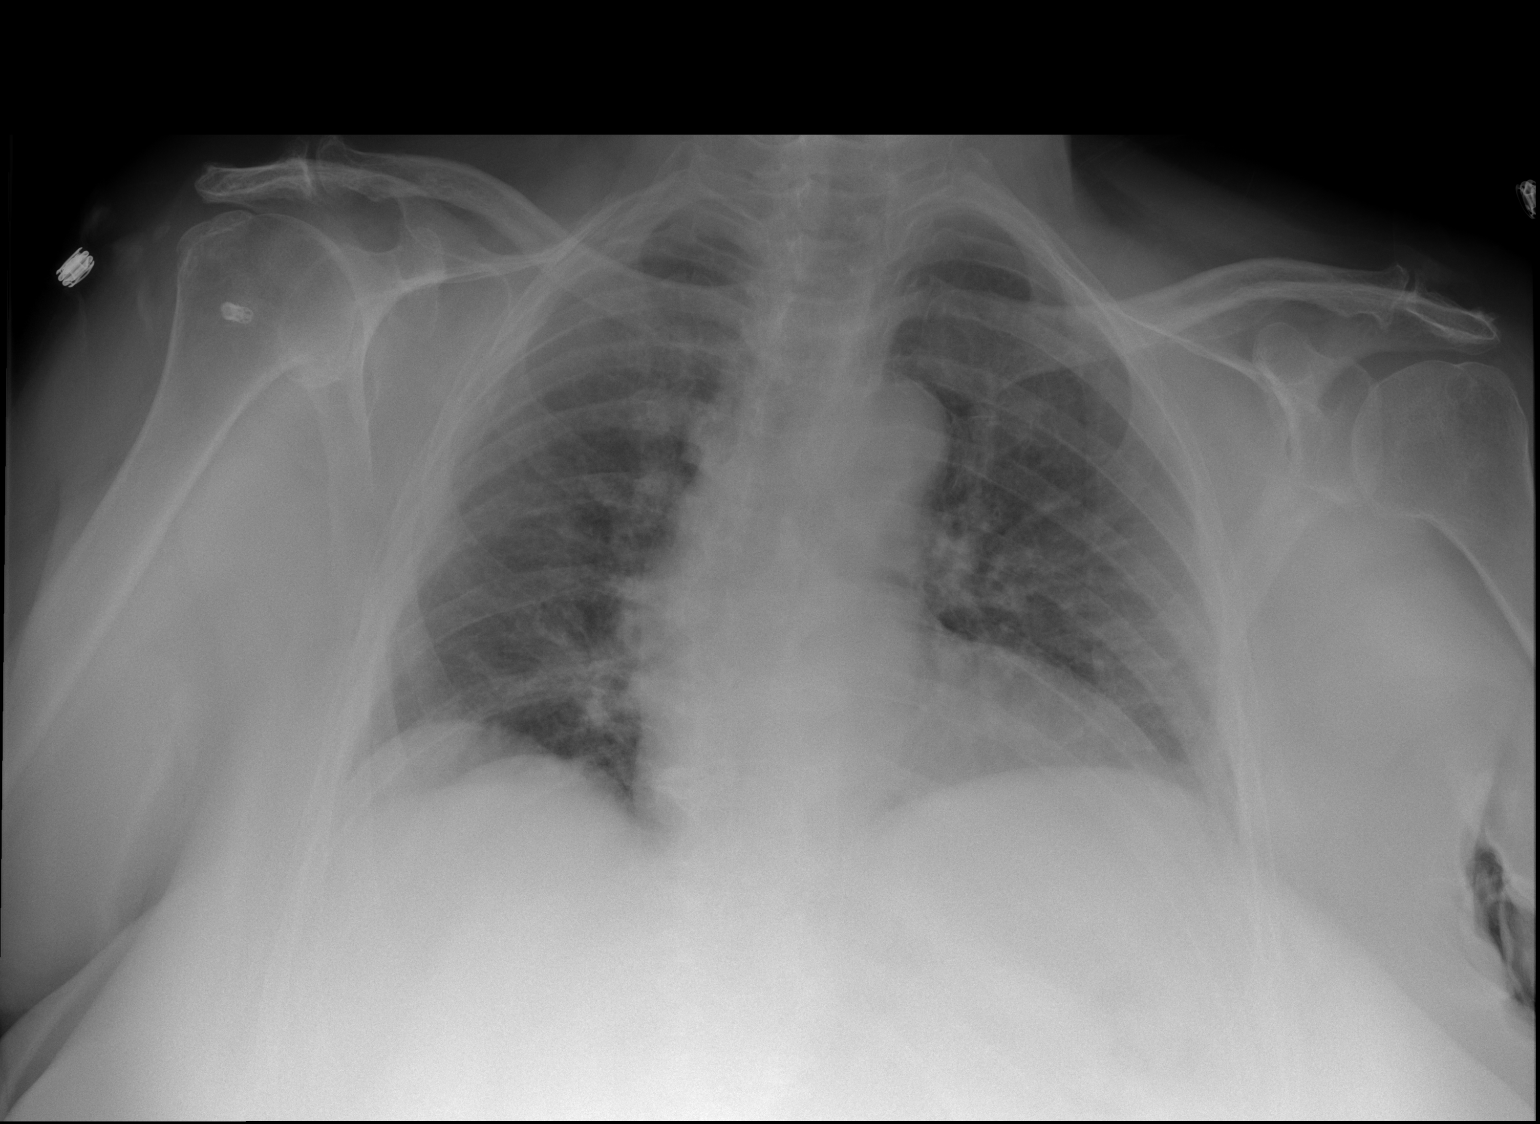

[2 of 2 positions shown; findings below may reference images not displayed]

FINDINGS: The lungs are clear. Heart size is normal. No pneumothorax or
pleural effusion. No acute bony abnormality.
IMPRESSION: No acute disease.

## 2018-07-27 ENCOUNTER — Other Ambulatory Visit: Payer: Self-pay

## 2018-07-27 ENCOUNTER — Ambulatory Visit: Payer: Self-pay

## 2018-07-27 ENCOUNTER — Encounter: Payer: Self-pay | Admitting: Orthopaedic Surgery

## 2018-07-27 ENCOUNTER — Ambulatory Visit (INDEPENDENT_AMBULATORY_CARE_PROVIDER_SITE_OTHER): Payer: Medicare PPO | Admitting: Orthopaedic Surgery

## 2018-07-27 VITALS — BP 148/76 | HR 80 | Ht 64.0 in | Wt 237.0 lb

## 2018-07-27 DIAGNOSIS — M48062 Spinal stenosis, lumbar region with neurogenic claudication: Secondary | ICD-10-CM | POA: Diagnosis not present

## 2018-07-27 DIAGNOSIS — G8929 Other chronic pain: Secondary | ICD-10-CM

## 2018-07-27 DIAGNOSIS — M7062 Trochanteric bursitis, left hip: Secondary | ICD-10-CM

## 2018-07-27 DIAGNOSIS — M25562 Pain in left knee: Secondary | ICD-10-CM

## 2018-07-27 MED ORDER — METHYLPREDNISOLONE ACETATE 40 MG/ML IJ SUSP
40.0000 mg | INTRAMUSCULAR | Status: AC | PRN
  Administered 2018-07-27: 40 mg via INTRA_ARTICULAR

## 2018-07-27 MED ORDER — BUPIVACAINE HCL 0.25 % IJ SOLN
2.0000 mL | INTRAMUSCULAR | Status: AC | PRN
  Administered 2018-07-27: 2 mL via INTRA_ARTICULAR

## 2018-07-27 MED ORDER — LIDOCAINE HCL 1 % IJ SOLN
0.5000 mL | INTRAMUSCULAR | Status: AC | PRN
  Administered 2018-07-27: .5 mL

## 2018-07-27 NOTE — Progress Notes (Signed)
Office Visit Note   Patient: Amy Curtis           Date of Birth: September 20, 1940           MRN: [id number] Visit Date: 07/27/2018              Requested by: Rolan Buccoompton, Kimberly D., PA-C [address] [place],  [place] [phone] PCP: Rolan Buccoompton, Kimberly D., PA-C   Assessment & Plan: Visit Diagnoses:  1. Spinal stenosis, lumbar region, with neurogenic claudication   2.    Left trochanteric bursitis  Plan: Knee x-rays show good position no subsidence.  She got improvement in her lateral trochanteric pain with trochanteric injection.  We will set up for physical therapy working on her balance lower extremity strengthening.  She has lost 14 pounds but still has morbid obesity and at age 89 her ability to ambulate is significantly declined.  She is having trouble going to church hopefully should get better with therapy and we will recheck her back in 5 weeks.  Follow-Up Instructions: No follow-ups on file.   Orders:  Orders Placed This Encounter  Procedures  . XR Lumbar Spine 2-3 Views   No orders of the defined types were placed in this encounter.     Procedures: Large Joint Inj: L greater trochanter on 07/27/2018 12:02 PM Details: lateral approach Medications: 0.5 mL lidocaine 1 %; 2 mL bupivacaine 0.25 %; 40 mg methylPREDNISolone acetate 40 MG/ML      Clinical Data: No additional findings.   Subjective: Chief Complaint  Patient presents with  . Lower Back - Pain    HPI 78 year old female returns she states she is having progressive pain some in her back a lot laterally in her hips that radiates down toward her knees.  She has had 2 lumbar surgeries last was a migrated fragment with right foot drop.  Anterior tib returned but she still has a little bit of residual numbness to the tip of her toes.  She states that it is difficult for her to go to church she uses either a walker or cane and states she is concerned that she may fall she has had some progressive weakness in both legs.  She  states she can stand about 15 minutes and has to sit down.  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  No associated bowel bladder symptoms.  She states she lost 14 pounds and her BMI now is at 40.  She has some pain left knee over the proximal tibia after total knee arthroplasty 2017.  Opposite right knee has osteoarthritis.  More left trochanteric pain than right.  Review of Systems L3-4 decompression 11/05/2013 for spinal stenosis.  Right L4 hemilaminectomy removal of free fragment 07/05/2016.  Total knee arthroplasty left on 03/26/2015.  14 point systems otherwise noncontributory.   Objective: Vital Signs: BP (!) 148/76   Pulse 80   Ht 5\' 4"  (1.626 m)   Wt 237 lb (107.5 kg)   BMI 40.68 kg/m   Physical Exam Constitutional:      Appearance: She is well-developed.  HENT:     Head: Normocephalic.     Right Ear: External ear normal.     Left Ear: External ear normal.  Eyes:     Pupils: Pupils are equal, round, and reactive to light.  Neck:     Thyroid: No thyromegaly.     Trachea: No tracheal deviation.  Cardiovascular:     Rate and Rhythm: Normal rate.  Pulmonary:     Effort: Pulmonary effort is normal.  Abdominal:     Palpations: Abdomen is soft.  Skin:    General: Skin is warm and dry.  Neurological:     Mental Status: She is alert and oriented to person, place, and time.  Psychiatric:        Behavior: Behavior normal.     Ortho Exam patient slow getting on and off the exam table has to use her cane to ambulate bilateral trochanteric tenderness more severe on the left than right she has good quad strength with resistive testing no knee effusion collateral ligaments are stable good collateral balance flexion and extension.  Anterior tib gastrocsoleus is strong both right and left.  2-3+ ankle and knee jerks symmetrical.  Specialty Comments:  No specialty comments available.  Imaging: No results found.   PMFS History: Patient Active  Problem List   Diagnosis Date Noted  . Herniated nucleus pulposus, lumbar 07/05/2016  . Lumbar herniated disc 07/05/2016  . 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 Spinal stenosis, lumbar region, with neurogenic claudication 11/05/2013  . Lumbar spinal stenosis 11/05/2013   Past Medical History:  Diagnosis Date  . Anxiety   . Arthritis    "back, legs, knees" (03/27/2015)  . Cataract    left eye  . Depression   . GERD (gastroesophageal reflux disease)   . Glaucoma    R eye only but uses gtts in both eyes  . H/O hiatal hernia   . HNP (herniated nucleus pulposus)    right lumbar 4-5  . HOH (hard of hearing)   . Hypertension   . Hypothyroidism   . Migraines    "stopped when my periods stopped"  . PONV (postoperative nausea and vomiting)   . Shortness of breath   . Sleep apnea    "tried CPAP; couldn't wear it"   . Type II diabetes mellitus ([place])    losing weight & careful dietary choices, no Rx (03/27/2015)  . Wears dentures   . Wears glasses     Family History  Problem Relation Age of Onset  . Lung disease Mother   . Lung disease Father     Past Surgical History:  Procedure Laterality Date  . BACK SURGERY    . CARPAL TUNNEL RELEASE Right   . CATARACT EXTRACTION    . COLONOSCOPY    . DILATION AND CURETTAGE OF UTERUS  X 2  . JOINT REPLACEMENT    . [place] SURGERY  2011   done in [place]  . LUMBAR LAMINECTOMY N/A 11/05/2013   Procedure: L3-4 Decompression;  Surgeon: Marybelle Killings, MD;  Location: [facility];  Service: Orthopedics;  Laterality: N/A;  . LUMBAR LAMINECTOMY Right 07/05/2016   Procedure: RIGHT L-4 HEMILAMINECTOMY, LAMINOTOMY L-5 S-1 ;  Surgeon: Marybelle Killings, MD;  Location: [facility];  Service: Orthopedics;  Laterality: Right;  . MULTIPLE TOOTH EXTRACTIONS    . SHOULDER ARTHROSCOPY W/ ROTATOR CUFF REPAIR Right ?2013  . TONSILLECTOMY    . TOTAL KNEE ARTHROPLASTY Left 03/26/2015   Procedure: TOTAL KNEE ARTHROPLASTY;  Surgeon: Marybelle Killings, MD;  Location:  [facility];  Service: Orthopedics;  Laterality: Left;  . TUBAL LIGATION     Social History   Occupational History  . Not on file  Tobacco Use  . Smoking status: Never Smoker  . Smokeless tobacco: Never Used  Substance and Sexual Activity  . Alcohol use: No  . Drug use: No  . Sexual  activity: Never

## 2018-08-31 ENCOUNTER — Other Ambulatory Visit: Payer: Self-pay

## 2018-08-31 ENCOUNTER — Encounter: Payer: Self-pay | Admitting: Orthopaedic Surgery

## 2018-08-31 ENCOUNTER — Ambulatory Visit (INDEPENDENT_AMBULATORY_CARE_PROVIDER_SITE_OTHER): Payer: Medicare PPO | Admitting: Orthopaedic Surgery

## 2018-08-31 VITALS — BP 163/94 | HR 87 | Ht 64.0 in | Wt 237.0 lb

## 2018-08-31 DIAGNOSIS — M6281 Muscle weakness (generalized): Secondary | ICD-10-CM | POA: Diagnosis not present

## 2018-08-31 DIAGNOSIS — Z96652 Presence of left artificial knee joint: Secondary | ICD-10-CM | POA: Diagnosis not present

## 2018-08-31 NOTE — Progress Notes (Signed)
Office Visit Note   Patient: Amy Curtis           Date of Birth: 12-15-1940           MRN: [id number] Visit Date: 08/31/2018              Requested by: Rolan Buccoompton, Kimberly D., PA-C [address] [place],  [place] [id number] PCP: Rolan Buccoompton, Kimberly D., PA-C   Assessment & Plan: Visit Diagnoses:  1. Status post total left knee replacement   2. Quadriceps weakness     Plan: Continue therapy and strengthening exercises.  I will follow her up on an as-needed basis we discussed the importance of weight loss with her morbid obesity and continued strengthening to decrease fall risk.  Follow-Up Instructions: Return if symptoms worsen or fail to improve.   Orders:  No orders of the defined types were placed in this encounter.  No orders of the defined types were placed in this encounter.     Procedures: No procedures performed   Clinical Data: No additional findings.   Subjective: Chief Complaint  Patient presents with  . Lower Back - Pain    Has gone for physical therapy [facility]     HPI 78 year old female returns post lumbar decompression with bilateral lower extremity weakness, morbid obesity.  She has been going to physical therapy and is noted improvement in her gait with quadricep strengthening.  She had an injection in her trochanter which also helped at last visit 07/27/2018.  She had foot drop preop for back surgery postop she still has some residual slight anterior tib weakness.  She has been using a walker initially but now can walk with a cane and can walk across the office today without the cane but still has some balance issues and bilateral quad weakness.  She states "therapy has really help me".  Review of Systems review of systems updated unchanged from 07/27/2018 office visit other than as mentioned in HPI.  14 point systems reviewed.   Objective: Vital Signs: BP (!) 163/94   Pulse 87   Ht 5\' 4"  (1.626 m)   Wt 237 lb (107.5 kg)   BMI 40.68 kg/m   Physical Exam  Constitutional:      Appearance: She is well-developed.  HENT:     Head: Normocephalic.     Right Ear: External ear normal.     Left Ear: External ear normal.  Eyes:     Pupils: Pupils are equal, round, and reactive to light.  Neck:     Thyroid: No thyromegaly.     Trachea: No tracheal deviation.  Cardiovascular:     Rate and Rhythm: Normal rate.  Pulmonary:     Effort: Pulmonary effort is normal.  Abdominal:     Palpations: Abdomen is soft.  Skin:    General: Skin is warm and dry.  Neurological:     Mental Status: She is alert and oriented to person, place, and time.  Psychiatric:        Behavior: Behavior normal.     Ortho Exam well-healed left total knee incision.  Well-healed lumbar incision.  Residual mild anterior tib weakness.  She is walking with a cane in the office better and can walk across the office exam room without the cane but has problems when she turns and pivots with some balance issues due to some lower extremity weakness.  Distal pulses are intact.  Specialty Comments:  No specialty comments available.  Imaging: No results found.   PMFS  History: Patient Active Problem List   Diagnosis Date Noted  . Quadriceps weakness 08/31/2018  . Herniated nucleus pulposus, lumbar 07/05/2016  . Lumbar herniated disc 07/05/2016  . 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 Spinal stenosis, lumbar region, with neurogenic claudication 11/05/2013  . Lumbar spinal stenosis 11/05/2013   Past Medical History:  Diagnosis Date  . Anxiety   . Arthritis    "back, legs, knees" (03/27/2015)  . Cataract    left eye  . Depression   . GERD (gastroesophageal reflux disease)   . Glaucoma    R eye only but uses gtts in both eyes  . H/O hiatal hernia   . HNP (herniated nucleus pulposus)    right lumbar 4-5  . HOH (hard of hearing)   . Hypertension   . Hypothyroidism   . Migraines    "stopped when my periods stopped"  . PONV (postoperative nausea and  vomiting)   . Shortness of breath   . Sleep apnea    "tried CPAP; couldn't wear it"   . Type II diabetes mellitus ([place])    losing weight & careful dietary choices, no Rx (03/27/2015)  . Wears dentures   . Wears glasses     Family History  Problem Relation Age of Onset  . Lung disease Mother   . Lung disease Father     Past Surgical History:  Procedure Laterality Date  . BACK SURGERY    . CARPAL TUNNEL RELEASE Right   . CATARACT EXTRACTION    . COLONOSCOPY    . DILATION AND CURETTAGE OF UTERUS  X 2  . JOINT REPLACEMENT    . [place] SURGERY  2011   done in [place]  . LUMBAR LAMINECTOMY N/A 11/05/2013   Procedure: L3-4 Decompression;  Surgeon: Marybelle Killings, MD;  Location: [facility];  Service: Orthopedics;  Laterality: N/A;  . LUMBAR LAMINECTOMY Right 07/05/2016   Procedure: RIGHT L-4 HEMILAMINECTOMY, LAMINOTOMY L-5 S-1 ;  Surgeon: Marybelle Killings, MD;  Location: [facility];  Service: Orthopedics;  Laterality: Right;  . MULTIPLE TOOTH EXTRACTIONS    . SHOULDER ARTHROSCOPY W/ ROTATOR CUFF REPAIR Right ?2013  . TONSILLECTOMY    . TOTAL KNEE ARTHROPLASTY Left 03/26/2015   Procedure: TOTAL KNEE ARTHROPLASTY;  Surgeon: Marybelle Killings, MD;  Location: [facility];  Service: Orthopedics;  Laterality: Left;  . TUBAL LIGATION     Social History   Occupational History  . Not on file  Tobacco Use  . Smoking status: Never Smoker  . Smokeless tobacco: Never Used  Substance and Sexual Activity  . Alcohol use: No  . Drug use: No  . Sexual activity: Never

## 2020-04-24 ENCOUNTER — Encounter: Payer: Self-pay | Admitting: Orthopaedic Surgery

## 2020-04-24 ENCOUNTER — Other Ambulatory Visit: Payer: Self-pay

## 2020-04-24 ENCOUNTER — Ambulatory Visit (INDEPENDENT_AMBULATORY_CARE_PROVIDER_SITE_OTHER): Payer: Medicare PPO | Admitting: Orthopaedic Surgery

## 2020-04-24 DIAGNOSIS — M1711 Unilateral primary osteoarthritis, right knee: Secondary | ICD-10-CM

## 2020-04-24 MED ORDER — BUPIVACAINE HCL 0.25 % IJ SOLN
4.0000 mL | INTRAMUSCULAR | Status: AC | PRN
  Administered 2020-04-24: 4 mL via INTRA_ARTICULAR

## 2020-04-24 MED ORDER — METHYLPREDNISOLONE ACETATE 40 MG/ML IJ SUSP
40.0000 mg | INTRAMUSCULAR | Status: AC | PRN
  Administered 2020-04-24: 40 mg via INTRA_ARTICULAR

## 2020-04-24 MED ORDER — LIDOCAINE HCL 1 % IJ SOLN
0.5000 mL | INTRAMUSCULAR | Status: AC | PRN
  Administered 2020-04-24: .5 mL

## 2020-04-24 NOTE — Progress Notes (Signed)
Office Visit Note   Patient: Amy Curtis           Date of Birth: 1940-04-11           MRN: [id number] Visit Date: 04/24/2020              Requested by: Rolan Bucco., PA-C [address] [place],  [place] [address] PCP: Rolan Bucco., PA-C   Assessment & Plan: Visit Diagnoses:  1. Unilateral primary osteoarthritis, right knee     Plan: Right knee injection performed.  She has about total knee arthroplasty we discussed she needs to lose a few more pounds to get below BMI 40.  Hopefully the injection will help so she can ambulate more.  We discussed things in her diet that are causing increased weight gain.  Recheck 5 weeks.  Follow-Up Instructions: No follow-ups on file.   Orders:  Orders Placed This Encounter  Procedures  . Large Joint Inj   No orders of the defined types were placed in this encounter.     Procedures: Large Joint Inj: R knee on 04/24/2020 10:20 AM Indications: pain and joint swelling Details: 22 G 1.5 in needle, anterolateral approach  Arthrogram: No  Medications: 40 mg methylPREDNISolone acetate 40 MG/ML; 0.5 mL lidocaine 1 %; 4 mL bupivacaine 0.25 % Outcome: tolerated well, no immediate complications Procedure, treatment alternatives, risks and benefits explained, specific risks discussed. Consent was given by the patient. Immediately prior to procedure a time out was called to verify the correct patient, procedure, equipment, support staff and site/side marked as required. Patient was prepped and draped in the usual sterile fashion.       Clinical Data: No additional findings.   Subjective: Chief Complaint  Patient presents with  . Right Foot - Pain  . Right Leg - Pain    HPI 80 year old female seen with increased right knee pain difficulty reaching full extension.  She has had previous left total knee arthroplasty in 2017 by me doing well.  Decompression surgery L3-4 in 2015 and later decompression L5-S1 in 2018.  She has had  decreased ambulation due to increased right knee pain.  She still has some numbness in her foot on the right.  No bowel bladder symptoms.  Increased difficulty ambulating due to right knee pain.  Review of Systems updated unchanged from 07/27/2018 other than as mentioned above.   Objective: Vital Signs: Ht 5' 3.5" (1.613 m)   Wt 231 lb (104.8 kg)   BMI 40.28 kg/m   Physical Exam Constitutional:      Appearance: She is well-developed.  HENT:     Head: Normocephalic.     Right Ear: External ear normal.     Left Ear: External ear normal.  Eyes:     Pupils: Pupils are equal, round, and reactive to light.  Neck:     Thyroid: No thyromegaly.     Trachea: No tracheal deviation.  Cardiovascular:     Rate and Rhythm: Normal rate.  Pulmonary:     Effort: Pulmonary effort is normal.  Abdominal:     Palpations: Abdomen is soft.  Skin:    General: Skin is warm and dry.  Neurological:     Mental Status: She is alert and oriented to person, place, and time.  Psychiatric:        Behavior: Behavior normal.     Ortho Exam right knee lacks 15 degrees reaching full extension.  Collateral ligaments are stable.  Difficult to palpate her patella with  a thickened adipose layer.  ACL PCL exam is normal negative logroll the hips.  Well-healed lumbar incision.  Anterior tib gastrocsoleus is strong right and left. Specialty Comments:  No specialty comments available.  Imaging: No results found.   PMFS History: Patient Active Problem List   Diagnosis Date Noted  . Unilateral primary osteoarthritis, right knee 04/24/2020  . Quadriceps weakness 08/31/2018  . Herniated nucleus pulposus, lumbar 07/05/2016  . Lumbar herniated disc 07/05/2016  . 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 Spinal stenosis, lumbar region, with neurogenic claudication 11/05/2013  . Lumbar spinal stenosis 11/05/2013   Past Medical History:  Diagnosis Date  . Anxiety   . Arthritis    "back, legs, knees"  (03/27/2015)  . Cataract    left eye  . Depression   . GERD (gastroesophageal reflux disease)   . Glaucoma    R eye only but uses gtts in both eyes  . H/O hiatal hernia   . HNP (herniated nucleus pulposus)    right lumbar 4-5  . HOH (hard of hearing)   . Hypertension   . Hypothyroidism   . Migraines    "stopped when my periods stopped"  . PONV (postoperative nausea and vomiting)   . Shortness of breath   . Sleep apnea    "tried CPAP; couldn't wear it"   . Type II diabetes mellitus ([facility])    losing weight & careful dietary choices, no Rx (03/27/2015)  . Wears dentures   . Wears glasses     Family History  Problem Relation Age of Onset  . Lung disease Mother   . Lung disease Father     Past Surgical History:  Procedure Laterality Date  . BACK SURGERY    . CARPAL TUNNEL RELEASE Right   . CATARACT EXTRACTION    . COLONOSCOPY    . DILATION AND CURETTAGE OF UTERUS  X 2  . JOINT REPLACEMENT    . LUMBAR DISC SURGERY  2011   done in [place]  . LUMBAR LAMINECTOMY N/A 11/05/2013   Procedure: L3-4 Decompression;  Surgeon: Eldred Manges, MD;  [facility];  Service: Orthopedics;  Laterality: N/A;  . LUMBAR LAMINECTOMY Right 07/05/2016   Procedure: RIGHT L-4 HEMILAMINECTOMY, LAMINOTOMY L-5 S-1 ;  Surgeon: Eldred Manges, MD;  Location[facility];  Service: Orthopedics;  Laterality: Right;  . MULTIPLE TOOTH EXTRACTIONS    . SHOULDER ARTHROSCOPY W/ ROTATOR CUFF REPAIR Right ?2013  . TONSILLECTOMY    . TOTAL KNEE ARTHROPLASTY Left 03/26/2015   Procedure: TOTAL KNEE ARTHROPLASTY;  Surgeon: Eldred Manges, MD;  Location: [facility] OR;  Service: Orthopedics;  Laterality: Left;  . TUBAL LIGATION     Social History   Occupational History  . Not on file  Tobacco Use  . Smoking status: Never Smoker  . Smokeless tobacco: Never Used  Vaping Use  . Vaping Use: Never used  Substance and Sexual Activity  . Alcohol use: No  . Drug use: No  . Sexual activity: Never

## 2020-05-29 ENCOUNTER — Ambulatory Visit (INDEPENDENT_AMBULATORY_CARE_PROVIDER_SITE_OTHER): Payer: Medicare PPO | Admitting: Orthopaedic Surgery

## 2020-05-29 ENCOUNTER — Ambulatory Visit: Payer: Self-pay

## 2020-05-29 VITALS — Wt 231.6 lb

## 2020-05-29 DIAGNOSIS — M48062 Spinal stenosis, lumbar region with neurogenic claudication: Secondary | ICD-10-CM | POA: Diagnosis not present

## 2020-05-29 DIAGNOSIS — M1711 Unilateral primary osteoarthritis, right knee: Secondary | ICD-10-CM | POA: Diagnosis not present

## 2020-05-29 NOTE — Progress Notes (Signed)
Office Visit Note   Patient: Amy Curtis           Date of Birth: 20-Jan-1940           MRN: [id number] Visit Date: 05/29/2020              Requested by: Rolan Bucco., PA-C [address] [place],  [place] [address] PCP: Rolan Bucco., PA-C   Assessment & Plan: Visit Diagnoses:  1. Spinal stenosis, lumbar region, with neurogenic claudication   2. Unilateral primary osteoarthritis, right knee     Plan: Patient tried to get in the pool this summer to work on continued weight loss.  She can return if she has increased symptoms in her right knee.  She is happy with the results of the left total knee arthroplasty from 5 years ago.  She has neurogenic claudication symptoms cannot stand or walk very far and when she goes to the grocery store she can work on leaning on a grocery cart to get in some extra walking.  She can follow-up when she has increased symptoms.  Follow-Up Instructions: No follow-ups on file.   Orders:  Orders Placed This Encounter  Procedures  . XR Lumbar Spine 2-3 Views   No orders of the defined types were placed in this encounter.     Procedures: No procedures performed   Clinical Data: No additional findings.   Subjective: Chief Complaint  Patient presents with  . Right Knee - Follow-up  . Lower Back - Pain    HPI 80 year old female returns she states the knee injection right knee in April gave her some improvement.  Still bothers her some but she still has problems with her back off and on.  She has some retrolisthesis at L3-4 previous lumbar decompression and she has some hyperlordosis with lumbar curvature multilevel facet arthropathy.  Left total knee arthroplasty is doing well from 2017.  She is lost 5 pounds and is trying to get her BMI below 40 in case her right knee become so severe that she needs total knee arthroplasty.  She is here with her sister and in the summertime she has a niece that has a pool that she can get into exercise.   [place]  Review of Systems updated unchanged, all other systems.   Objective: Vital Signs: Wt 231 lb 9.6 oz (105.1 kg)   BMI 40.38 kg/m   Physical Exam Constitutional:      Appearance: She is well-developed.  HENT:     Head: Normocephalic.     Right Ear: External ear normal.     Left Ear: External ear normal.  Eyes:     Pupils: Pupils are equal, round, and reactive to light.  Neck:     Thyroid: No thyromegaly.     Trachea: No tracheal deviation.  Cardiovascular:     Rate and Rhythm: Normal rate.  Pulmonary:     Effort: Pulmonary effort is normal.  Abdominal:     Palpations: Abdomen is soft.  Skin:    General: Skin is warm and dry.  Neurological:     Mental Status: She is alert and oriented to person, place, and time.  Psychiatric:        Behavior: Behavior normal.     Ortho Exam crepitus right knee with range of motion.  Tenderness to the lumbar spine.  Well-healed midline incision.  Negative logroll hips.  She can ambulate without her long cane.  Short stride gait  balance is fair.  Specialty Comments:  No specialty comments available.  Imaging: No results found.   PMFS History: Patient Active Problem List   Diagnosis Date Noted  . Unilateral primary osteoarthritis, right knee 04/24/2020  . Quadriceps weakness 08/31/2018  . Herniated nucleus pulposus, lumbar 07/05/2016  . Lumbar herniated disc 07/05/2016  . 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 Spinal stenosis, lumbar region, with neurogenic claudication 11/05/2013  . Lumbar spinal stenosis 11/05/2013   Past Medical History:  Diagnosis Date  . Anxiety   . Arthritis    "back, legs, knees" (03/27/2015)  . Cataract    left eye  . Depression   . GERD (gastroesophageal reflux disease)   . Glaucoma    R eye only but uses gtts in both eyes  . H/O hiatal hernia   . HNP (herniated nucleus pulposus)    right lumbar 4-5  . HOH (hard of hearing)   .  Hypertension   . Hypothyroidism   . Migraines    "stopped when my periods stopped"  . PONV (postoperative nausea and vomiting)   . Shortness of breath   . Sleep apnea    "tried CPAP; couldn't wear it"   . Type II diabetes mellitus ([facility])    losing weight & careful dietary choices, no Rx (03/27/2015)  . Wears dentures   . Wears glasses     Family History  Problem Relation Age of Onset  . Lung disease Mother   . Lung disease Father     Past Surgical History:  Procedure Laterality Date  . BACK SURGERY    . CARPAL TUNNEL RELEASE Right   . CATARACT EXTRACTION    . COLONOSCOPY    . DILATION AND CURETTAGE OF UTERUS  X 2  . JOINT REPLACEMENT    . LUMBAR DISC SURGERY  2011   done in [address]  . LUMBAR LAMINECTOMY N/A 11/05/2013   Procedure: L3-4 Decompression;  Surgeon: Eldred Manges, MD;  Location: [facility] OR;  Service: Orthopedics;  Laterality: N/A;  . LUMBAR LAMINECTOMY Right 07/05/2016   Procedure: RIGHT L-4 HEMILAMINECTOMY, LAMINOTOMY L-5 S-1 ;  Surgeon: Eldred Manges, MD;  Location[facility] OR;  Service: Orthopedics;  Laterality: Right;  . MULTIPLE TOOTH EXTRACTIONS    . SHOULDER ARTHROSCOPY W/ ROTATOR CUFF REPAIR Right ?2013  . TONSILLECTOMY    . TOTAL KNEE ARTHROPLASTY Left 03/26/2015   Procedure: TOTAL KNEE ARTHROPLASTY;  Surgeon: Eldred Manges, MD;  Location: [facility] OR;  Service: Orthopedics;  Laterality: Left;  . TUBAL LIGATION     Social History   Occupational History  . Not on file  Tobacco Use  . Smoking status: Never Smoker  . Smokeless tobacco: Never Used  Vaping Use  . Vaping Use: Never used  Substance and Sexual Activity  . Alcohol use: No  . Drug use: No  . Sexual activity: Never

## 2020-08-14 ENCOUNTER — Other Ambulatory Visit: Payer: Self-pay

## 2020-08-14 ENCOUNTER — Ambulatory Visit: Payer: Medicare PPO | Admitting: Orthopaedic Surgery

## 2021-10-27 LAB — LAB REPORT - SCANNED
A1c: 7.3
EGFR: 79

## 2022-03-25 ENCOUNTER — Ambulatory Visit (INDEPENDENT_AMBULATORY_CARE_PROVIDER_SITE_OTHER): Payer: Medicare PPO

## 2022-03-25 ENCOUNTER — Ambulatory Visit: Payer: Medicare PPO | Admitting: Orthopaedic Surgery

## 2022-03-25 ENCOUNTER — Encounter: Payer: Self-pay | Admitting: Orthopaedic Surgery

## 2022-03-25 VITALS — Ht 63.5 in | Wt 211.4 lb

## 2022-03-25 DIAGNOSIS — M25561 Pain in right knee: Secondary | ICD-10-CM

## 2022-03-25 DIAGNOSIS — G8929 Other chronic pain: Secondary | ICD-10-CM | POA: Diagnosis not present

## 2022-03-25 DIAGNOSIS — M1711 Unilateral primary osteoarthritis, right knee: Secondary | ICD-10-CM

## 2022-03-25 NOTE — Progress Notes (Signed)
Office Visit Note   Patient: Amy Curtis           Date of Birth: 06-01-1940           MRN: [id number] Visit Date: 03/25/2022              Requested by: Teofilo Pod., [place] [address] PCP: Teofilo Pod., PA-C   Assessment & Plan: Visit Diagnoses:  1. Chronic pain of right knee   2. Unilateral primary osteoarthritis, right knee     Plan: Patient states she like to proceed after mid April where she has some family functions scheduled prior to that.  She is a minimal Hydrographic surveyor.  She went home after the previous procedure and has family available to help.  Reviewed x-rays and discussed again Home therapy outpatient therapy importance of compliance.  She states she is put up with right knee pain as long as she can and it and wants to proceed with total knee arthroplasty while she can.  Decision for surgery made today.  She would need preoperative medical clearance.  Follow-Up Instructions: No follow-ups on file.   Orders:  Orders Placed This Encounter  Procedures   XR KNEE 3 VIEW RIGHT   No orders of the defined types were placed in this encounter.     Procedures: No procedures performed   Clinical Data: No additional findings.   Subjective: Chief Complaint  Patient presents with   Right Knee - Pain    HPI 82 year old female here to discuss right total knee arthroplasty.  She still has some anterior discomfort with her left total knee but states the right knee grabs catches and gives way on her and she is concerned about falling.  She does have gout she is on allopurinol.  She ambulates with a walker also with a cane.  BMI 36.  Patient takes metformin for diabetes.  X-rays left knee shows well-positioned total knee arthroplasty without loosening or subsidence.  Review of Systems positive for diabetes on oral medication.  MI 45.  Previous left total knee arthroplasty.   Objective: Vital Signs: Ht 5' 3.5" (1.613 m)   Wt 211  lb 6.4 oz (95.9 kg)   BMI 36.86 kg/m   Physical Exam  Ortho Exam  Specialty Comments:  No specialty comments available.  Imaging: No results found.   PMFS History: Patient Active Problem List   Diagnosis Date Noted   Unilateral primary osteoarthritis, right knee 04/24/2020   Quadriceps weakness 08/31/2018   Herniated nucleus pulposus, lumbar 07/05/2016   Lumbar herniated disc 07/05/2016   Status post total left knee replacement 03/26/2015   Spinal stenosis, lumbar region, with neurogenic claudication 11/05/2013   Lumbar spinal stenosis 11/05/2013   Past Medical History:  Diagnosis Date   Anxiety    Arthritis    "back, legs, knees" (03/27/2015)   Cataract    left eye   Depression    GERD (gastroesophageal reflux disease)    Glaucoma    R eye only but uses gtts in both eyes   H/O hiatal hernia    HNP (herniated nucleus pulposus)    right lumbar 4-5   HOH (hard of hearing)    Hypertension    Hypothyroidism    Migraines    "stopped when my periods stopped"   PONV (postoperative nausea and vomiting)    Shortness of breath    Sleep apnea    "tried CPAP; couldn't wear it"    Type  II diabetes mellitus ([place])    losing weight & careful dietary choices, no Rx (03/27/2015)   Wears dentures    Wears glasses     Family History  Problem Relation Age of Onset   Lung disease Mother    Lung disease Father     Past Surgical History:  Procedure Laterality Date   BACK SURGERY     CARPAL TUNNEL RELEASE Right    CATARACT EXTRACTION     COLONOSCOPY     DILATION AND CURETTAGE OF UTERUS  X 2   JOINT REPLACEMENT     LUMBAR [place] SURGERY  2011   done in [place] N/A 11/05/2013   Procedure: L3-4 Decompression;  Surgeon: Marybelle Killings, MD;  Location: [facility];  Service: Orthopedics;  Laterality: N/A;   LUMBAR LAMINECTOMY Right 07/05/2016   Procedure: RIGHT L-4 HEMILAMINECTOMY, LAMINOTOMY L-5 S-1 ;  Surgeon: Marybelle Killings, MD;  Location: [facility];  Service:  Orthopedics;  Laterality: Right;   MULTIPLE TOOTH EXTRACTIONS     SHOULDER ARTHROSCOPY W/ ROTATOR CUFF REPAIR Right ?2013   TONSILLECTOMY     TOTAL KNEE ARTHROPLASTY Left 03/26/2015   Procedure: TOTAL KNEE ARTHROPLASTY;  Surgeon: Marybelle Killings, MD;  Location: [facility];  Service: Orthopedics;  Laterality: Left;   TUBAL LIGATION     Social History   Occupational History   Not on file  Tobacco Use   Smoking status: Never   Smokeless tobacco: Never  Vaping Use   Vaping Use: Never used  Substance and Sexual Activity   Alcohol use: No   Drug use: No   Sexual activity: Never

## 2022-04-27 ENCOUNTER — Other Ambulatory Visit: Payer: Self-pay | Admitting: Physician Assistant

## 2022-04-27 NOTE — Progress Notes (Signed)
Surgical Instructions    Your procedure is scheduled on Friday, 05/07/22.  Report to [facility] Main Entrance "A" at 5:30 A.M., then check in with the Admitting office.  Call this number if you have problems the morning of surgery:  [phone]   If you have any questions prior to your surgery date call [phone]: Open Monday-Friday 8am-4pm If you experience any cold or flu symptoms such as cough, fever, chills, shortness of breath, etc. between now and your scheduled surgery, please notify us at the above number     Remember:  Do not eat after midnight the night before your surgery  You may drink clear liquids until 4:30am the morning of your surgery.   Clear liquids allowed are: Water, Non-Citrus Juices (without pulp), Carbonated Beverages, Clear Tea, Black Coffee ONLY (NO MILK, CREAM OR POWDERED CREAMER of any kind), and Gatorade  Patient Instructions  The night before surgery:  No food after midnight. ONLY clear liquids after midnight  The day of surgery (if you have diabetes): Drink ONE (1) 12 oz G2 given to you in your pre admission testing appointment by 4:30am the morning of surgery. Drink in one sitting. Do not sip.  This drink was given to you during your hospital  pre-op appointment visit.  Nothing else to drink after completing the  12 oz bottle of G2.         If you have questions, please contact your surgeon's office.     Take these medicines the morning of surgery with A SIP OF WATER:  allopurinol (ZYLOPRIM)  dorzolamide-timolol (COSOPT) eye drops omeprazole (PRILOSEC)  PARoxetine (PAXIL)    As of today, STOP taking any Aspirin (unless otherwise instructed by your surgeon) Aleve, Naproxen, Ibuprofen, Motrin, Advil, Goody's, BC's, all herbal medications, fish oil, and all vitamins.  WHAT DO I DO ABOUT MY DIABETES MEDICATION?   Do not take oral diabetes medicines (pills) the morning of surgery.  The day of surgery, do not take other diabetes injectables,  including Byetta (exenatide), Bydureon (exenatide ER), Victoza (liraglutide), or Trulicity (dulaglutide).  If your CBG is greater than 220 mg/dL, you may take  of your sliding scale (correction) dose of insulin.   HOW TO MANAGE YOUR DIABETES BEFORE AND AFTER SURGERY  Why is it important to control my blood sugar before and after surgery? Improving blood sugar levels before and after surgery helps healing and can limit problems. A way of improving blood sugar control is eating a healthy diet by:  Eating less sugar and carbohydrates  Increasing activity/exercise  Talking with your doctor about reaching your blood sugar goals High blood sugars (greater than 180 mg/dL) can raise your risk of infections and slow your recovery, so you will need to focus on controlling your diabetes during the weeks before surgery. Make sure that the doctor who takes care of your diabetes knows about your planned surgery including the date and location.  How do I manage my blood sugar before surgery? Check your blood sugar at least 4 times a day, starting 2 days before surgery, to make sure that the level is not too high or low.  Check your blood sugar the morning of your surgery when you wake up and every 2 hours until you get to the Short Stay unit.  If your blood sugar is less than 70 mg/dL, you will need to treat for low blood sugar: Do not take insulin. Treat a low blood sugar (less than 70 mg/dL) with  cup of clear  juice (cranberry or apple), 4 glucose tablets, OR glucose gel. Recheck blood sugar in 15 minutes after treatment (to make sure it is greater than 70 mg/dL). If your blood sugar is not greater than 70 mg/dL on recheck, call [phone] for further instructions. Report your blood sugar to the short stay nurse when you get to Short Stay.  If you are admitted to the hospital after surgery: Your blood sugar will be checked by the staff and you will probably be given insulin after surgery (instead  of oral diabetes medicines) to make sure you have good blood sugar levels. The goal for blood sugar control after surgery is 80-180 mg/dL.            Do not wear jewelry or makeup. Do not wear lotions, powders, perfumes or deodorant. Do not shave 48 hours prior to surgery.   Do not bring valuables to the hospital. Do not wear nail polish, gel polish, artificial nails, or any other type of covering on natural nails (fingers and toes) If you have artificial nails or gel coating that need to be removed by a nail salon, please have this removed prior to surgery. Artificial nails or gel coating may interfere with anesthesia's ability to adequately monitor your vital signs.  [facility] is not responsible for any belongings or valuables.    Do NOT Smoke (Tobacco/Vaping)  24 hours prior to your procedure  If you use a CPAP at night, you may bring your mask for your overnight stay.   Contacts, glasses, hearing aids, dentures or partials may not be worn into surgery, please bring cases for these belongings   For patients admitted to the hospital, discharge time will be determined by your treatment team.   Patients discharged the day of surgery will not be allowed to drive home, and someone needs to stay with them for 24 hours.   SURGICAL WAITING ROOM VISITATION Patients having surgery or a procedure may have no more than 2 support people in the waiting area - these visitors may rotate.   Children under the age of 94 must have an adult with them who is not the patient. If the patient needs to stay at the hospital during part of their recovery, the visitor guidelines for inpatient rooms apply. Pre-op nurse will coordinate an appropriate time for 1 support person to accompany patient in pre-op.  This support person may not rotate.   Please refer to [email or web address] for the visitor guidelines for Inpatients (after your surgery is over and you are  in a regular room).    Special instructions:    Oral Hygiene is also important to reduce your risk of infection.  Remember - BRUSH YOUR TEETH THE MORNING OF SURGERY WITH YOUR REGULAR TOOTHPASTE    If you received a COVID test during your pre-op visit, it is requested that you wear a mask when out in public, stay away from anyone that may not be feeling well, and notify your surgeon if you develop symptoms. If you have been in contact with anyone that has tested positive in the last 10 days, please notify your surgeon.    Please read over the following fact sheets that you were given.

## 2022-04-28 ENCOUNTER — Encounter (HOSPITAL_COMMUNITY)
Admission: RE | Admit: 2022-04-28 | Discharge: 2022-04-28 | Disposition: A | Discharge status: Home / Self Care | Payer: Medicare PPO | Source: Ambulatory Visit | Attending: Orthopaedic Surgery | Admitting: Orthopaedic Surgery

## 2022-04-28 ENCOUNTER — Other Ambulatory Visit: Payer: Self-pay

## 2022-04-28 ENCOUNTER — Ambulatory Visit: Payer: Medicare PPO | Admitting: Physician Assistant

## 2022-04-28 ENCOUNTER — Encounter (HOSPITAL_COMMUNITY): Payer: Self-pay

## 2022-04-28 VITALS — BP 154/59 | HR 64 | Temp 98.0°F | Resp 18 | Ht 63.0 in | Wt 214.7 lb

## 2022-04-28 DIAGNOSIS — E119 Type 2 diabetes mellitus without complications: Secondary | ICD-10-CM | POA: Diagnosis not present

## 2022-04-28 DIAGNOSIS — Z01818 Encounter for other preprocedural examination: Secondary | ICD-10-CM | POA: Insufficient documentation

## 2022-04-28 LAB — CBC
HCT: 39.2 % (ref 36.0–46.0)
Hemoglobin: 13.5 g/dL (ref 12.0–15.0)
MCH: 30.9 pg (ref 26.0–34.0)
MCHC: 34.4 g/dL (ref 30.0–36.0)
MCV: 89.7 fL (ref 80.0–100.0)
Platelets: 224 10*3/uL (ref 150–400)
RBC: 4.37 MIL/uL (ref 3.87–5.11)
RDW: 13.2 % (ref 11.5–15.5)
WBC: 7.5 10*3/uL (ref 4.0–10.5)
nRBC: 0 % (ref 0.0–0.2)

## 2022-04-28 LAB — BASIC METABOLIC PANEL
Anion gap: 10 (ref 5–15)
BUN: 33 mg/dL — ABNORMAL HIGH (ref 8–23)
CO2: 25 mmol/L (ref 22–32)
Calcium: 10.4 mg/dL — ABNORMAL HIGH (ref 8.9–10.3)
Chloride: 100 mmol/L (ref 98–111)
Creatinine, Ser: 0.7 mg/dL (ref 0.44–1.00)
GFR, Estimated: >60 mL/min (ref >60)
Glucose, Bld: 133 mg/dL — ABNORMAL HIGH (ref 70–99)
Potassium: 3.5 mmol/L (ref 3.5–5.1)
Sodium: 135 mmol/L (ref 135–145)

## 2022-04-28 LAB — GLUCOSE, CAPILLARY: Glucose-Capillary: 125 mg/dL — ABNORMAL HIGH (ref 70–99)

## 2022-04-28 LAB — SURGICAL PCR SCREEN
MRSA, PCR: NEGATIVE
Staphylococcus aureus: NEGATIVE

## 2022-04-28 LAB — HEMOGLOBIN A1C
Hgb A1c MFr Bld: 6.3 % — ABNORMAL HIGH (ref 4.8–5.6)
Mean Plasma Glucose: 134.11 mg/dL

## 2022-04-28 NOTE — Progress Notes (Signed)
PCP - Berneda Rose Cardiologist - denies  PPM/ICD - denies  Chest x-ray - n/a EKG - 04/28/22 Stress Test - denies ECHO - denies Cardiac Cath - denies  Sleep Study - +OSA CPAP - does not wear nightly  Fasting Blood Sugar - 120-140 Checks Blood Sugar once a day   ERAS Protcol -yes PRE-SURGERY Ensure or G2- g2 ordered and given  COVID TEST- not needed   Anesthesia review: no  Patient denies shortness of breath, fever, cough and chest pain at PAT appointment   All instructions explained to the patient, with a verbal understanding of the material. Patient agrees to go over the instructions while at home for a better understanding. Patient also instructed to self quarantine after being tested for COVID-19. The opportunity to ask questions was provided.

## 2022-05-05 ENCOUNTER — Encounter: Payer: Self-pay | Admitting: Orthopaedic Surgery

## 2022-05-05 ENCOUNTER — Ambulatory Visit (INDEPENDENT_AMBULATORY_CARE_PROVIDER_SITE_OTHER): Payer: Medicare PPO | Admitting: Orthopaedic Surgery

## 2022-05-05 VITALS — BP 138/76 | Ht 63.0 in | Wt 214.0 lb

## 2022-05-05 DIAGNOSIS — M1711 Unilateral primary osteoarthritis, right knee: Secondary | ICD-10-CM | POA: Diagnosis not present

## 2022-05-05 NOTE — H&P (View-Only) (Signed)
Office Visit Note   Patient: Amy Curtis           Date of Birth: 1940-08-08           MRN: [id number] Visit Date: 05/05/2022              Requested by: Berneda Rose, FNP [phone] [place],  [place] [address] PCP: Berneda Rose, FNP   Assessment & Plan: Visit Diagnoses:  1. Unilateral primary osteoarthritis, right knee     Plan: Will get a Doppler test today to rule out DVT right lower extremity..  Results were negative for DVT done at [facility], [facility].  We discussed total knee arthroplasty again postop therapy importance of quad exercises range of motion use of walker preoperative block, Exparel and Marcaine, home PT followed by outpatient physical therapy.  Questions were elicited and answered she understands request to proceed.  Follow-Up Instructions: No follow-ups on file.   Orders:  No orders of the defined types were placed in this encounter.  No orders of the defined types were placed in this encounter.     Procedures: No procedures performed   Clinical Data: No additional findings.   Subjective: Chief Complaint  Patient presents with   history and physical    05/07/2022 Right TKA    HPI 82 year old female returns for preoperative visit to discuss right total knee arthroplasty.  She is recently noted swelling in the right ankle and foot but does not recall any specific injury this is a new finding.  Patient's had previous left total knee arthroplasty 2017.  Lumbar decompression L3-4 2015 and L4-5 2018.  She still has some ongoing back pain without neurogenic claudication.  Problems with right knee pain swelling difficulty walking she has to use a walker or cane.  Problems sleeping.  Plain radiograph shows bone-on-bone changes right knee with marginal osteophytes subchondral sclerosis and well-positioned left total knee without loosening.  Review of Systems past surgery for spinal stenosis left total knee arthroplasty.  Problems with  obesity.  Postop quad weakness left leg requiring more extensive postop physical therapy.  Negative for fever chills negative for stroke or heart attack.   Objective: Vital Signs: BP 138/76   Ht 5\' 3"  (1.6 m)   Wt 214 lb (97.1 kg)   BMI 37.91 kg/m   Physical Exam Constitutional:      Appearance: She is well-developed.  HENT:     Head: Normocephalic.     Right Ear: External ear normal.     Left Ear: External ear normal. There is no impacted cerumen.  Eyes:     Pupils: Pupils are equal, round, and reactive to light.  Neck:     Thyroid: No thyromegaly.     Trachea: No tracheal deviation.  Cardiovascular:     Rate and Rhythm: Normal rate and regular rhythm.  Pulmonary:     Effort: Pulmonary effort is normal.     Breath sounds: Normal breath sounds.     Comments: Lungs are clear at the apices and also at the base right and left.  No rales or rhonchi. Abdominal:     Palpations: Abdomen is soft.  Musculoskeletal:     Cervical back: No rigidity.  Skin:    General: Skin is warm and dry.  Neurological:     Mental Status: She is alert and oriented to person, place, and time.  Psychiatric:        Behavior: Behavior normal.     Ortho Exam right  knee crepitus range of motion 10 to 100 degrees.  Collateral ligaments are stable.  Palpable marginal osteophytes.  Negative logroll of the hip.  Pedal pulses palpable she has significant swelling mid calf distal subcutaneously without ecchymosis.  Some tenderness anteriorly over the ankle and anterolateral with negative anterior drawer.  Peroneal is strong no palpable defect in the peroneal tendon.  Specialty Comments:  No specialty comments available.  Imaging:XR KNEE 3 VIEW RIGHT (Accession [id number]) (Order [id number]) Imaging Date: 03/25/2022 Department: [facility] Ordering/Authorizing: Eldred Manges, MD   Exam Status  Status  Final [99]   Result Narrative  Three-view x-rays right knee demonstrates bone-on-bone  changes right knee marginal osteophytes subchondral sclerosis.  Well-positioned left total knee arthroplasty without loosening or subsidence.  Impression: Right knee moderate to severe osteoarthritis , tricompartmental.    PMFS History: Patient Active Problem List   Diagnosis Date Noted   Unilateral primary osteoarthritis, right knee 04/24/2020   Quadriceps weakness 08/31/2018   Herniated nucleus pulposus, lumbar 07/05/2016   Lumbar herniated disc 07/05/2016   Status post total left knee replacement 03/26/2015   Spinal stenosis, lumbar region, with neurogenic claudication 11/05/2013   Lumbar spinal stenosis 11/05/2013   Past Medical History:  Diagnosis Date   Anxiety    Arthritis    "back, legs, knees" (03/27/2015)   Cataract    left eye   Depression    GERD (gastroesophageal reflux disease)    Glaucoma    R eye only but uses gtts in both eyes   H/O hiatal hernia    HNP (herniated nucleus pulposus)    right lumbar 4-5   HOH (hard of hearing)    Hypertension    Hypothyroidism    Migraines    "stopped when my periods stopped"   PONV (postoperative nausea and vomiting)    Shortness of breath    Sleep apnea    "tried CPAP; couldn't wear it"    Type II diabetes mellitus    losing weight & careful dietary choices, no Rx (03/27/2015)   Wears dentures    Wears glasses     Family History  Problem Relation Age of Onset   Lung disease Mother    Lung disease Father     Past Surgical History:  Procedure Laterality Date   BACK SURGERY     CARPAL TUNNEL RELEASE Right    CATARACT EXTRACTION     COLONOSCOPY     DILATION AND CURETTAGE OF UTERUS  X 2   JOINT REPLACEMENT     LUMBAR DISC SURGERY  2011   done in [place]   LUMBAR LAMINECTOMY N/A 11/05/2013   Procedure: L3-4 Decompression;  Surgeon: Eldred Manges, MD;  Location: [facility] OR;  Service: Orthopedics;  Laterality: N/A;   LUMBAR LAMINECTOMY Right 07/05/2016   Procedure: RIGHT L-4 HEMILAMINECTOMY, LAMINOTOMY L-5 S-1 ;   Surgeon: Eldred Manges, MD;  Location: [facility] OR;  Service: Orthopedics;  Laterality: Right;   MULTIPLE TOOTH EXTRACTIONS     SHOULDER ARTHROSCOPY W/ ROTATOR CUFF REPAIR Right ?2013   TONSILLECTOMY     TOTAL KNEE ARTHROPLASTY Left 03/26/2015   Procedure: TOTAL KNEE ARTHROPLASTY;  Surgeon: Eldred Manges, MD;  Location: [facility] OR;  Service: Orthopedics;  Laterality: Left;   TUBAL LIGATION     Social History   Occupational History   Not on file  Tobacco Use   Smoking status: Never   Smokeless tobacco: Never  Vaping Use   Vaping Use: Never used  Substance  and Sexual Activity   Alcohol use: No   Drug use: No   Sexual activity: Never

## 2022-05-05 NOTE — Progress Notes (Unsigned)
Office Visit Note   Patient: Amy Curtis           Date of Birth: 1940/05/17           MRN: [id number] Visit Date: 05/05/2022              Requested by: Berneda Rose, FNP [phone] [place],  [place] [address] PCP: Berneda Rose, FNP   Assessment & Plan: Visit Diagnoses: No diagnosis found.  Plan: ***  Follow-Up Instructions: No follow-ups on file.   Orders:  No orders of the defined types were placed in this encounter.  No orders of the defined types were placed in this encounter.     Procedures: No procedures performed   Clinical Data: No additional findings.   Subjective: Chief Complaint  Patient presents with   history and physical    05/07/2022 Right TKA    HPI  Review of Systems   Objective: Vital Signs: BP 138/76   Ht 5\' 3"  (1.6 m)   Wt 214 lb (97.1 kg)   BMI 37.91 kg/m   Physical Exam  Ortho Exam  Specialty Comments:  No specialty comments available.  Imaging: No results found.   PMFS History: Patient Active Problem List   Diagnosis Date Noted   Unilateral primary osteoarthritis, right knee 04/24/2020   Quadriceps weakness 08/31/2018   Herniated nucleus pulposus, lumbar 07/05/2016   Lumbar herniated disc 07/05/2016   Status post total left knee replacement 03/26/2015   Spinal stenosis, lumbar region, with neurogenic claudication 11/05/2013   Lumbar spinal stenosis 11/05/2013   Past Medical History:  Diagnosis Date   Anxiety    Arthritis    "back, legs, knees" (03/27/2015)   Cataract    left eye   Depression    GERD (gastroesophageal reflux disease)    Glaucoma    R eye only but uses gtts in both eyes   H/O hiatal hernia    HNP (herniated nucleus pulposus)    right lumbar 4-5   HOH (hard of hearing)    Hypertension    Hypothyroidism    Migraines    "stopped when my periods stopped"   PONV (postoperative nausea and vomiting)    Shortness of breath    Sleep apnea    "tried CPAP; couldn't wear it"    Type  II diabetes mellitus    losing weight & careful dietary choices, no Rx (03/27/2015)   Wears dentures    Wears glasses     Family History  Problem Relation Age of Onset   Lung disease Mother    Lung disease Father     Past Surgical History:  Procedure Laterality Date   BACK SURGERY     CARPAL TUNNEL RELEASE Right    CATARACT EXTRACTION     COLONOSCOPY     DILATION AND CURETTAGE OF UTERUS  X 2   JOINT REPLACEMENT     LUMBAR DISC SURGERY  2011   done in [place]   LUMBAR LAMINECTOMY N/A 11/05/2013   Procedure: L3-4 Decompression;  Surgeon: Eldred Manges, MD;  Location: [facility] OR;  Service: Orthopedics;  Laterality: N/A;   LUMBAR LAMINECTOMY Right 07/05/2016   Procedure: RIGHT L-4 HEMILAMINECTOMY, LAMINOTOMY L-5 S-1 ;  Surgeon: Eldred Manges, MD;  Location: [facility] OR;  Service: Orthopedics;  Laterality: Right;   MULTIPLE TOOTH EXTRACTIONS     SHOULDER ARTHROSCOPY W/ ROTATOR CUFF REPAIR Right ?2013   TONSILLECTOMY     TOTAL KNEE ARTHROPLASTY Left 03/26/2015   Procedure:  TOTAL KNEE ARTHROPLASTY;  Surgeon: Eldred Manges, MD;  Location: [facility];  Service: Orthopedics;  Laterality: Left;   TUBAL LIGATION     Social History   Occupational History   Not on file  Tobacco Use   Smoking status: Never   Smokeless tobacco: Never  Vaping Use   Vaping Use: Never used  Substance and Sexual Activity   Alcohol use: No   Drug use: No   Sexual activity: Never

## 2022-05-07 ENCOUNTER — Other Ambulatory Visit: Payer: Self-pay

## 2022-05-07 ENCOUNTER — Encounter (HOSPITAL_COMMUNITY)
Admission: AD | Disposition: A | Discharge status: Home Health | Payer: Self-pay | Source: Home / Self Care | Attending: Orthopaedic Surgery

## 2022-05-07 ENCOUNTER — Ambulatory Visit (HOSPITAL_COMMUNITY): Payer: Medicare PPO | Admitting: Anesthesiology

## 2022-05-07 ENCOUNTER — Inpatient Hospital Stay (HOSPITAL_COMMUNITY)
Admission: AD | Admit: 2022-05-07 | Discharge: 2022-05-10 | DRG: 470 | Disposition: A | Discharge status: Home Health | Payer: Medicare PPO | Attending: Orthopaedic Surgery | Admitting: Orthopaedic Surgery

## 2022-05-07 ENCOUNTER — Encounter (HOSPITAL_COMMUNITY): Payer: Self-pay | Admitting: Orthopaedic Surgery

## 2022-05-07 DIAGNOSIS — G4730 Sleep apnea, unspecified: Secondary | ICD-10-CM

## 2022-05-07 DIAGNOSIS — Z9841 Cataract extraction status, right eye: Secondary | ICD-10-CM

## 2022-05-07 DIAGNOSIS — J449 Chronic obstructive pulmonary disease, unspecified: Secondary | ICD-10-CM | POA: Diagnosis present

## 2022-05-07 DIAGNOSIS — E039 Hypothyroidism, unspecified: Secondary | ICD-10-CM | POA: Diagnosis present

## 2022-05-07 DIAGNOSIS — H409 Unspecified glaucoma: Secondary | ICD-10-CM | POA: Diagnosis present

## 2022-05-07 DIAGNOSIS — E119 Type 2 diabetes mellitus without complications: Secondary | ICD-10-CM

## 2022-05-07 DIAGNOSIS — Z96651 Presence of right artificial knee joint: Principal | ICD-10-CM

## 2022-05-07 DIAGNOSIS — F32A Depression, unspecified: Secondary | ICD-10-CM | POA: Diagnosis present

## 2022-05-07 DIAGNOSIS — M1711 Unilateral primary osteoarthritis, right knee: Secondary | ICD-10-CM | POA: Diagnosis not present

## 2022-05-07 DIAGNOSIS — Z91048 Other nonmedicinal substance allergy status: Secondary | ICD-10-CM

## 2022-05-07 DIAGNOSIS — Z96659 Presence of unspecified artificial knee joint: Secondary | ICD-10-CM

## 2022-05-07 DIAGNOSIS — I10 Essential (primary) hypertension: Secondary | ICD-10-CM | POA: Diagnosis not present

## 2022-05-07 DIAGNOSIS — M5126 Other intervertebral disc displacement, lumbar region: Secondary | ICD-10-CM | POA: Diagnosis present

## 2022-05-07 DIAGNOSIS — D62 Acute posthemorrhagic anemia: Secondary | ICD-10-CM | POA: Diagnosis not present

## 2022-05-07 DIAGNOSIS — Z96652 Presence of left artificial knee joint: Secondary | ICD-10-CM | POA: Diagnosis present

## 2022-05-07 DIAGNOSIS — G8929 Other chronic pain: Secondary | ICD-10-CM | POA: Diagnosis present

## 2022-05-07 DIAGNOSIS — Z9842 Cataract extraction status, left eye: Secondary | ICD-10-CM

## 2022-05-07 DIAGNOSIS — K219 Gastro-esophageal reflux disease without esophagitis: Secondary | ICD-10-CM | POA: Diagnosis present

## 2022-05-07 HISTORY — PX: TOTAL KNEE ARTHROPLASTY: SHX125

## 2022-05-07 LAB — GLUCOSE, CAPILLARY
Glucose-Capillary: 146 mg/dL — ABNORMAL HIGH (ref 70–99)
Glucose-Capillary: 181 mg/dL — ABNORMAL HIGH (ref 70–99)

## 2022-05-07 SURGERY — ARTHROPLASTY, KNEE, TOTAL
Anesthesia: Regional | Site: Knee | Laterality: Right

## 2022-05-07 MED ORDER — VITAMIN D3 125 MCG (5000 UT) PO CAPS: 5000.0000 [IU] | ORAL_CAPSULE | Freq: Every day | ORAL | Status: DC

## 2022-05-07 MED ORDER — LACTATED RINGERS IV SOLN: INTRAVENOUS | Status: DC

## 2022-05-07 MED ORDER — ONDANSETRON HCL 4 MG/2ML IJ SOLN
INTRAMUSCULAR | Status: DC | PRN
  Administered 2022-05-07: 4 mg via INTRAVENOUS

## 2022-05-07 MED ORDER — ACETAMINOPHEN 500 MG PO TABS
1000.0000 mg | ORAL_TABLET | Freq: Once | ORAL | Status: AC
  Administered 2022-05-07: 1000 mg via ORAL
  Filled 2022-05-07: qty 2

## 2022-05-07 MED ORDER — PROPOFOL 10 MG/ML IV BOLUS
INTRAVENOUS | Status: DC | PRN
  Administered 2022-05-07: 50 mg via INTRAVENOUS
  Administered 2022-05-07: 70 mg via INTRAVENOUS
  Administered 2022-05-07: 50 mg via INTRAVENOUS
  Administered 2022-05-07: 30 mg via INTRAVENOUS

## 2022-05-07 MED ORDER — DORZOLAMIDE HCL-TIMOLOL MAL 2-0.5 % OP SOLN
1.0000 [drp] | Freq: Two times a day (BID) | OPHTHALMIC | Status: DC
  Administered 2022-05-08 – 2022-05-10 (×6): 1 [drp] via OPHTHALMIC
  Filled 2022-05-07: qty 10

## 2022-05-07 MED ORDER — DEXAMETHASONE SODIUM PHOSPHATE 4 MG/ML IJ SOLN
INTRAMUSCULAR | Status: DC | PRN
  Administered 2022-05-07: 4 mg via INTRAVENOUS

## 2022-05-07 MED ORDER — PRIMIDONE 50 MG PO TABS
50.0000 mg | ORAL_TABLET | Freq: Every day | ORAL | Status: DC
  Administered 2022-05-07 – 2022-05-09 (×3): 50 mg via ORAL
  Filled 2022-05-07 (×4): qty 1

## 2022-05-07 MED ORDER — METOCLOPRAMIDE HCL 5 MG/ML IJ SOLN: 5.0000 mg | Freq: Three times a day (TID) | INTRAMUSCULAR | Status: DC | PRN

## 2022-05-07 MED ORDER — BUPIVACAINE HCL (PF) 0.25 % IJ SOLN
INTRAMUSCULAR | Status: AC
  Filled 2022-05-07: qty 30

## 2022-05-07 MED ORDER — ASPIRIN 325 MG PO TABS: 325.0000 mg | ORAL_TABLET | Freq: Every day | ORAL | Status: AC

## 2022-05-07 MED ORDER — NABUMETONE 500 MG PO TABS: 500.0000 mg | ORAL_TABLET | Freq: Two times a day (BID) | ORAL | Status: DC | PRN

## 2022-05-07 MED ORDER — CEFAZOLIN SODIUM-DEXTROSE 2-4 GM/100ML-% IV SOLN
2.0000 g | INTRAVENOUS | Status: AC
  Administered 2022-05-07: 2 g via INTRAVENOUS
  Filled 2022-05-07: qty 100

## 2022-05-07 MED ORDER — HYDROCODONE-ACETAMINOPHEN 5-325 MG PO TABS
1.0000 | ORAL_TABLET | ORAL | Status: DC | PRN
  Administered 2022-05-07 – 2022-05-10 (×4): 2 via ORAL
  Filled 2022-05-07 (×4): qty 2

## 2022-05-07 MED ORDER — ALLOPURINOL 100 MG PO TABS
200.0000 mg | ORAL_TABLET | Freq: Every day | ORAL | Status: DC
  Administered 2022-05-08 – 2022-05-10 (×3): 200 mg via ORAL
  Filled 2022-05-07 (×3): qty 2

## 2022-05-07 MED ORDER — PANTOPRAZOLE SODIUM 40 MG PO TBEC
80.0000 mg | DELAYED_RELEASE_TABLET | Freq: Every day | ORAL | Status: DC
  Administered 2022-05-08 – 2022-05-10 (×3): 80 mg via ORAL
  Filled 2022-05-07 (×3): qty 2

## 2022-05-07 MED ORDER — BUPIVACAINE HCL (PF) 0.25 % IJ SOLN
INTRAMUSCULAR | Status: DC | PRN
  Administered 2022-05-07: 20 mL

## 2022-05-07 MED ORDER — FENTANYL CITRATE (PF) 250 MCG/5ML IJ SOLN
INTRAMUSCULAR | Status: AC
  Filled 2022-05-07: qty 5

## 2022-05-07 MED ORDER — ADULT MULTIVITAMIN W/MINERALS CH
1.0000 | ORAL_TABLET | Freq: Every day | ORAL | Status: DC
  Administered 2022-05-08 – 2022-05-10 (×3): 1 via ORAL
  Filled 2022-05-07 (×3): qty 1

## 2022-05-07 MED ORDER — STERILE WATER FOR IRRIGATION IR SOLN
Status: DC | PRN
  Administered 2022-05-07: 1000 mL

## 2022-05-07 MED ORDER — LATANOPROST 0.005 % OP SOLN
1.0000 [drp] | Freq: Every day | OPHTHALMIC | Status: DC
  Administered 2022-05-08 – 2022-05-09 (×3): 1 [drp] via OPHTHALMIC
  Filled 2022-05-07: qty 2.5

## 2022-05-07 MED ORDER — BUPIVACAINE LIPOSOME 1.3 % IJ SUSP
INTRAMUSCULAR | Status: AC
  Filled 2022-05-07: qty 20

## 2022-05-07 MED ORDER — MORPHINE SULFATE (PF) 2 MG/ML IV SOLN
0.5000 mg | INTRAVENOUS | Status: DC | PRN
  Administered 2022-05-08: 1 mg via INTRAVENOUS
  Filled 2022-05-07: qty 1

## 2022-05-07 MED ORDER — PAROXETINE HCL 20 MG PO TABS
20.0000 mg | ORAL_TABLET | Freq: Every day | ORAL | Status: DC
  Administered 2022-05-08 – 2022-05-10 (×3): 20 mg via ORAL
  Filled 2022-05-07 (×3): qty 1

## 2022-05-07 MED ORDER — FLEET ENEMA 7-19 GM/118ML RE ENEM: 1.0000 | ENEMA | Freq: Once | RECTAL | Status: DC | PRN

## 2022-05-07 MED ORDER — ACETAMINOPHEN 500 MG PO TABS
500.0000 mg | ORAL_TABLET | Freq: Four times a day (QID) | ORAL | Status: AC
  Administered 2022-05-07 – 2022-05-08 (×4): 500 mg via ORAL
  Filled 2022-05-07 (×3): qty 1

## 2022-05-07 MED ORDER — VERAPAMIL HCL ER 180 MG PO TBCR
180.0000 mg | EXTENDED_RELEASE_TABLET | Freq: Two times a day (BID) | ORAL | Status: DC
  Administered 2022-05-07 – 2022-05-10 (×6): 180 mg via ORAL
  Filled 2022-05-07 (×7): qty 1

## 2022-05-07 MED ORDER — LISINOPRIL 20 MG PO TABS
20.0000 mg | ORAL_TABLET | Freq: Every day | ORAL | Status: DC
  Administered 2022-05-07 – 2022-05-10 (×4): 20 mg via ORAL
  Filled 2022-05-07 (×4): qty 1

## 2022-05-07 MED ORDER — CHLORHEXIDINE GLUCONATE 0.12 % MT SOLN
15.0000 mL | Freq: Once | OROMUCOSAL | Status: DC
  Filled 2022-05-07: qty 15

## 2022-05-07 MED ORDER — SODIUM CHLORIDE 0.9 % IR SOLN
Status: DC | PRN
  Administered 2022-05-07: 3000 mL

## 2022-05-07 MED ORDER — MAGNESIUM 100 MG PO TABS: 100.0000 mg | ORAL_TABLET | Freq: Every day | ORAL | Status: DC

## 2022-05-07 MED ORDER — KETOCONAZOLE 2 % EX CREA: 1.0000 | TOPICAL_CREAM | Freq: Two times a day (BID) | CUTANEOUS | Status: DC | PRN

## 2022-05-07 MED ORDER — MENTHOL 3 MG MT LOZG: 1.0000 | LOZENGE | OROMUCOSAL | Status: DC | PRN

## 2022-05-07 MED ORDER — ONDANSETRON HCL 4 MG PO TABS: 4.0000 mg | ORAL_TABLET | Freq: Four times a day (QID) | ORAL | Status: DC | PRN

## 2022-05-07 MED ORDER — ORAL CARE MOUTH RINSE: 15.0000 mL | Freq: Once | OROMUCOSAL | Status: DC

## 2022-05-07 MED ORDER — MIDAZOLAM HCL 2 MG/2ML IJ SOLN
INTRAMUSCULAR | Status: AC
  Filled 2022-05-07: qty 2

## 2022-05-07 MED ORDER — PHENYLEPHRINE 80 MCG/ML (10ML) SYRINGE FOR IV PUSH (FOR BLOOD PRESSURE SUPPORT)
PREFILLED_SYRINGE | INTRAVENOUS | Status: DC | PRN
  Administered 2022-05-07: 160 ug via INTRAVENOUS
  Administered 2022-05-07: 80 ug via INTRAVENOUS
  Administered 2022-05-07: 160 ug via INTRAVENOUS

## 2022-05-07 MED ORDER — OXYCODONE-ACETAMINOPHEN 5-325 MG PO TABS: 1.0000 | ORAL_TABLET | ORAL | 0 refills | Status: DC | PRN

## 2022-05-07 MED ORDER — LIDOCAINE 2% (20 MG/ML) 5 ML SYRINGE
INTRAMUSCULAR | Status: DC | PRN
  Administered 2022-05-07: 40 mg via INTRAVENOUS

## 2022-05-07 MED ORDER — FENTANYL CITRATE (PF) 100 MCG/2ML IJ SOLN: 25.0000 ug | INTRAMUSCULAR | Status: DC | PRN

## 2022-05-07 MED ORDER — DOCUSATE SODIUM 100 MG PO CAPS
100.0000 mg | ORAL_CAPSULE | Freq: Two times a day (BID) | ORAL | Status: DC
  Administered 2022-05-07 – 2022-05-10 (×6): 100 mg via ORAL
  Filled 2022-05-07 (×6): qty 1

## 2022-05-07 MED ORDER — POLYETHYLENE GLYCOL 3350 17 G PO PACK: 17.0000 g | PACK | Freq: Every day | ORAL | Status: DC | PRN

## 2022-05-07 MED ORDER — PHENOL 1.4 % MT LIQD: 1.0000 | OROMUCOSAL | Status: DC | PRN

## 2022-05-07 MED ORDER — METHOCARBAMOL 500 MG PO TABS: 500.0000 mg | ORAL_TABLET | Freq: Three times a day (TID) | ORAL | 1 refills | Status: AC | PRN

## 2022-05-07 MED ORDER — BUPIVACAINE LIPOSOME 1.3 % IJ SUSP
INTRAMUSCULAR | Status: DC | PRN
  Administered 2022-05-07: 20 mL

## 2022-05-07 MED ORDER — EPHEDRINE SULFATE-NACL 50-0.9 MG/10ML-% IV SOSY
PREFILLED_SYRINGE | INTRAVENOUS | Status: DC | PRN
  Administered 2022-05-07 (×2): 5 mg via INTRAVENOUS

## 2022-05-07 MED ORDER — DIPHENHYDRAMINE HCL 12.5 MG/5ML PO ELIX: 12.5000 mg | ORAL_SOLUTION | ORAL | Status: DC | PRN

## 2022-05-07 MED ORDER — ACETAMINOPHEN 325 MG PO TABS: 325.0000 mg | ORAL_TABLET | Freq: Four times a day (QID) | ORAL | Status: DC | PRN

## 2022-05-07 MED ORDER — CHLORTHALIDONE 25 MG PO TABS
25.0000 mg | ORAL_TABLET | Freq: Every day | ORAL | Status: DC
  Administered 2022-05-08 – 2022-05-10 (×3): 25 mg via ORAL
  Filled 2022-05-07 (×4): qty 1

## 2022-05-07 MED ORDER — TRANEXAMIC ACID-NACL 1000-0.7 MG/100ML-% IV SOLN
INTRAVENOUS | Status: AC
  Filled 2022-05-07: qty 200

## 2022-05-07 MED ORDER — FENTANYL CITRATE (PF) 100 MCG/2ML IJ SOLN
INTRAMUSCULAR | Status: AC
  Filled 2022-05-07: qty 2

## 2022-05-07 MED ORDER — FERROUS SULFATE 325 (65 FE) MG PO TABS
325.0000 mg | ORAL_TABLET | Freq: Three times a day (TID) | ORAL | Status: DC
  Administered 2022-05-07 – 2022-05-10 (×10): 325 mg via ORAL
  Filled 2022-05-07 (×11): qty 1

## 2022-05-07 MED ORDER — 0.9 % SODIUM CHLORIDE (POUR BTL) OPTIME
TOPICAL | Status: DC | PRN
  Administered 2022-05-07: 1000 mL

## 2022-05-07 MED ORDER — TRANEXAMIC ACID-NACL 1000-0.7 MG/100ML-% IV SOLN
1000.0000 mg | INTRAVENOUS | Status: AC
  Administered 2022-05-07: 1000 mg via INTRAVENOUS
  Filled 2022-05-07: qty 100

## 2022-05-07 MED ORDER — METOCLOPRAMIDE HCL 5 MG PO TABS: 5.0000 mg | ORAL_TABLET | Freq: Three times a day (TID) | ORAL | Status: DC | PRN

## 2022-05-07 MED ORDER — ASPIRIN 325 MG PO TBEC
325.0000 mg | DELAYED_RELEASE_TABLET | Freq: Every day | ORAL | Status: DC
  Administered 2022-05-08 – 2022-05-10 (×3): 325 mg via ORAL
  Filled 2022-05-07 (×3): qty 1

## 2022-05-07 MED ORDER — BISACODYL 10 MG RE SUPP: 10.0000 mg | Freq: Every day | RECTAL | Status: DC | PRN

## 2022-05-07 MED ORDER — PROPOFOL 10 MG/ML IV BOLUS
INTRAVENOUS | Status: AC
  Filled 2022-05-07: qty 20

## 2022-05-07 MED ORDER — SODIUM CHLORIDE 0.9 % IV SOLN: INTRAVENOUS | Status: DC

## 2022-05-07 MED ORDER — ROCURONIUM BROMIDE 10 MG/ML (PF) SYRINGE
PREFILLED_SYRINGE | INTRAVENOUS | Status: DC | PRN
  Administered 2022-05-07: 50 mg via INTRAVENOUS

## 2022-05-07 MED ORDER — HYDROCODONE-ACETAMINOPHEN 7.5-325 MG PO TABS
1.0000 | ORAL_TABLET | ORAL | Status: DC | PRN
  Administered 2022-05-08: 1 via ORAL
  Administered 2022-05-08: 2 via ORAL
  Administered 2022-05-09: 1 via ORAL
  Filled 2022-05-07 (×2): qty 2
  Filled 2022-05-07: qty 1
  Filled 2022-05-07: qty 2

## 2022-05-07 MED ORDER — ONDANSETRON HCL 4 MG/2ML IJ SOLN: 4.0000 mg | Freq: Four times a day (QID) | INTRAMUSCULAR | Status: DC | PRN

## 2022-05-07 MED ORDER — PROPOFOL 500 MG/50ML IV EMUL
INTRAVENOUS | Status: DC | PRN
  Administered 2022-05-07: 75 ug/kg/min via INTRAVENOUS

## 2022-05-07 MED ORDER — SUGAMMADEX SODIUM 200 MG/2ML IV SOLN
INTRAVENOUS | Status: DC | PRN
  Administered 2022-05-07: 150 mg via INTRAVENOUS

## 2022-05-07 MED ORDER — FENTANYL CITRATE (PF) 100 MCG/2ML IJ SOLN
INTRAMUSCULAR | Status: DC | PRN
  Administered 2022-05-07: 25 ug via INTRAVENOUS
  Administered 2022-05-07 (×2): 50 ug via INTRAVENOUS
  Administered 2022-05-07: 25 ug via INTRAVENOUS
  Administered 2022-05-07: 50 ug via INTRAVENOUS

## 2022-05-07 SURGICAL SUPPLY — 75 items
ATTUNE PS FEM RT SZ 4 CEM KNEE (Femur) IMPLANT
ATTUNE PSRP INSR SZ4 5 KNEE (Insert) IMPLANT
BAG COUNTER SPONGE SURGICOUNT (BAG) ×1 IMPLANT
BAG SPNG CNTER NS LX DISP (BAG) ×1
BANDAGE ESMARK 6X9 LF (GAUZE/BANDAGES/DRESSINGS) ×1 IMPLANT
BASE TIBIAL ROT PLAT SZ 5 KNEE (Knees) IMPLANT
BLADE SAGITTAL 25.0X1.19X90 (BLADE) ×1 IMPLANT
BLADE SAW SGTL 13X75X1.27 (BLADE) ×1 IMPLANT
BNDG CMPR 9X6 STRL LF SNTH (GAUZE/BANDAGES/DRESSINGS) ×1
BNDG CMPR MED 10X6 ELC LF (GAUZE/BANDAGES/DRESSINGS) ×1
BNDG CMPR STD VLCR NS LF 5.8X4 (GAUZE/BANDAGES/DRESSINGS)
BNDG ELASTIC 4X5.8 VLCR NS LF (GAUZE/BANDAGES/DRESSINGS) ×1 IMPLANT
BNDG ELASTIC 4X5.8 VLCR STR LF (GAUZE/BANDAGES/DRESSINGS) ×1 IMPLANT
BNDG ELASTIC 6X10 VLCR STRL LF (GAUZE/BANDAGES/DRESSINGS) ×1 IMPLANT
BNDG ESMARK 6X9 LF (GAUZE/BANDAGES/DRESSINGS) ×1
BOWL SMART MIX CTS (DISPOSABLE) ×1 IMPLANT
BSPLAT TIB 5 CMNT ROT PLAT STR (Knees) ×1 IMPLANT
CEMENT HV SMART SET (Cement) ×2 IMPLANT
COOLER ICEMAN CLASSIC (MISCELLANEOUS) IMPLANT
COVER SURGICAL LIGHT HANDLE (MISCELLANEOUS) ×1 IMPLANT
CUFF TOURN SGL QUICK 34 (TOURNIQUET CUFF)
CUFF TOURN SGL QUICK 42 (TOURNIQUET CUFF) IMPLANT
CUFF TRNQT CYL 34X4.125X (TOURNIQUET CUFF) ×1 IMPLANT
DRAPE ORTHO SPLIT 77X108 STRL (DRAPES) ×2
DRAPE SURG ORHT 6 SPLT 77X108 (DRAPES) ×2 IMPLANT
DRAPE U-SHAPE 47X51 STRL (DRAPES) ×1 IMPLANT
DRSG MEPILEX POST OP 4X12 (GAUZE/BANDAGES/DRESSINGS) IMPLANT
DURAPREP 26ML APPLICATOR (WOUND CARE) ×2 IMPLANT
ELECT REM PT RETURN 9FT ADLT (ELECTROSURGICAL) ×1
ELECTRODE REM PT RTRN 9FT ADLT (ELECTROSURGICAL) ×1 IMPLANT
FACESHIELD WRAPAROUND (MASK) ×2 IMPLANT
FACESHIELD WRAPAROUND OR TEAM (MASK) ×2 IMPLANT
GAUZE PAD ABD 8X10 STRL (GAUZE/BANDAGES/DRESSINGS) ×1 IMPLANT
GAUZE SPONGE 4X4 12PLY STRL (GAUZE/BANDAGES/DRESSINGS) IMPLANT
GAUZE XEROFORM 5X9 LF (GAUZE/BANDAGES/DRESSINGS) ×1 IMPLANT
GLOVE BIOGEL PI IND STRL 8 (GLOVE) ×2 IMPLANT
GLOVE ORTHO TXT STRL SZ7.5 (GLOVE) ×2 IMPLANT
GOWN STRL REUS W/ TWL LRG LVL3 (GOWN DISPOSABLE) ×1 IMPLANT
GOWN STRL REUS W/ TWL XL LVL3 (GOWN DISPOSABLE) ×1 IMPLANT
GOWN STRL REUS W/TWL 2XL LVL3 (GOWN DISPOSABLE) ×1 IMPLANT
GOWN STRL REUS W/TWL LRG LVL3 (GOWN DISPOSABLE) ×1
GOWN STRL REUS W/TWL XL LVL3 (GOWN DISPOSABLE) ×1
HANDPIECE INTERPULSE COAX TIP (DISPOSABLE) ×1
IMMOBILIZER KNEE 22 UNIV (SOFTGOODS) ×1 IMPLANT
KIT BASIN OR (CUSTOM PROCEDURE TRAY) ×1 IMPLANT
KIT TURNOVER KIT B (KITS) ×1 IMPLANT
MANIFOLD NEPTUNE II (INSTRUMENTS) ×1 IMPLANT
MARKER SKIN DUAL TIP RULER LAB (MISCELLANEOUS) ×1 IMPLANT
NDL 18GX1X1/2 (RX/OR ONLY) (NEEDLE) ×1 IMPLANT
NDL HYPO 25GX1X1/2 BEV (NEEDLE) ×1 IMPLANT
NEEDLE 18GX1X1/2 (RX/OR ONLY) (NEEDLE) ×1 IMPLANT
NEEDLE HYPO 25GX1X1/2 BEV (NEEDLE) ×1 IMPLANT
NS IRRIG 1000ML POUR BTL (IV SOLUTION) ×1 IMPLANT
PACK TOTAL JOINT (CUSTOM PROCEDURE TRAY) ×1 IMPLANT
PAD ARMBOARD 7.5X6 YLW CONV (MISCELLANEOUS) ×2 IMPLANT
PAD COLD SHLDR WRAP-ON (PAD) IMPLANT
PADDING CAST COTTON 6X4 STRL (CAST SUPPLIES) ×1 IMPLANT
PATELLA MEDIAL ATTUN 35MM KNEE (Knees) IMPLANT
PIN STEINMAN FIXATION KNEE (PIN) IMPLANT
SET HNDPC FAN SPRY TIP SCT (DISPOSABLE) ×1 IMPLANT
STAPLER VISISTAT 35W (STAPLE) IMPLANT
SUCTION FRAZIER HANDLE 10FR (MISCELLANEOUS) ×1
SUCTION TUBE FRAZIER 10FR DISP (MISCELLANEOUS) ×1 IMPLANT
SUT VIC AB 0 CT1 27 (SUTURE) ×1
SUT VIC AB 0 CT1 27XBRD ANBCTR (SUTURE) ×1 IMPLANT
SUT VIC AB 1 CTX 36 (SUTURE) ×2
SUT VIC AB 1 CTX36XBRD ANBCTR (SUTURE) ×2 IMPLANT
SUT VIC AB 2-0 CT1 27 (SUTURE) ×2
SUT VIC AB 2-0 CT1 TAPERPNT 27 (SUTURE) ×2 IMPLANT
SYR 50ML LL SCALE MARK (SYRINGE) ×1 IMPLANT
SYR CONTROL 10ML LL (SYRINGE) ×1 IMPLANT
TIBIAL BASE ROT PLAT SZ 5 KNEE (Knees) ×1 IMPLANT
TOWEL GREEN STERILE (TOWEL DISPOSABLE) ×1 IMPLANT
TOWEL GREEN STERILE FF (TOWEL DISPOSABLE) ×1 IMPLANT
TRAY CATH INTERMITTENT SS 16FR (CATHETERS) IMPLANT

## 2022-05-07 NOTE — Discharge Instructions (Signed)

## 2022-05-07 NOTE — Transfer of Care (Signed)
Immediate Anesthesia Transfer of Care Note  Patient: Amy Curtis  Procedure(s) Performed: RIGHT TOTAL KNEE ARTHROPLASTY (Right: Knee)  Patient Location: PACU  Anesthesia Type:General and Regional  Level of Consciousness: awake and alert   Airway & Oxygen Therapy: Patient Spontanous Breathing and Patient connected to face mask oxygen  Post-op Assessment: Report given to RN and Post -op Vital signs reviewed and stable  Post vital signs: Reviewed and stable  Last Vitals:  Vitals Value Taken Time  BP 105/39 05/07/22 1000  Temp 36.6 C 05/07/22 0956  Pulse 59 05/07/22 1014  Resp 16 05/07/22 1014  SpO2 91 % 05/07/22 1014  Vitals shown include unvalidated device data.  Last Pain:  Vitals:   05/07/22 0618  TempSrc:   PainSc: 0-No pain         Complications: No notable events documented.

## 2022-05-07 NOTE — Anesthesia Preprocedure Evaluation (Addendum)
Anesthesia Evaluation  Patient identified by MRN, date of birth, ID band Patient awake    Reviewed: Allergy & Precautions, NPO status , Patient's Chart, lab work & pertinent test results  History of Anesthesia Complications (+) PONV and history of anesthetic complications  Airway Mallampati: III  TM Distance: >3 FB Neck ROM: Full    Dental  (+) Edentulous Upper, Edentulous Lower, Dental Advisory Given   Pulmonary sleep apnea    Pulmonary exam normal breath sounds clear to auscultation       Cardiovascular hypertension, Pt. on medications Normal cardiovascular exam Rhythm:Regular Rate:Normal     Neuro/Psych  Headaches PSYCHIATRIC DISORDERS Anxiety Depression       GI/Hepatic Neg liver ROS, hiatal hernia,GERD  ,,  Endo/Other  diabetes, Type 2    Renal/GU negative Renal ROS  negative genitourinary   Musculoskeletal negative musculoskeletal ROS (+)    Abdominal   Peds  Hematology negative hematology ROS (+)   Anesthesia Other Findings   Reproductive/Obstetrics                             Anesthesia Physical Anesthesia Plan  ASA: 3  Anesthesia Plan: General and Regional   Post-op Pain Management: Tylenol PO (pre-op)* and Regional block*   Induction:   PONV Risk Score and Plan: 4 or greater and Treatment may vary due to age or medical condition, Dexamethasone and Ondansetron  Airway Management Planned: Oral ETT  Additional Equipment:   Intra-op Plan:   Post-operative Plan: Extubation in OR  Informed Consent: I have reviewed the patients History and Physical, chart, labs and discussed the procedure including the risks, benefits and alternatives for the proposed anesthesia with the patient or authorized representative who has indicated his/her understanding and acceptance.     Dental advisory given  Plan Discussed with: CRNA  Anesthesia Plan Comments: (Pt declined spinal  anesthesia. )       Anesthesia Quick Evaluation

## 2022-05-07 NOTE — Evaluation (Signed)
Occupational Therapy Evaluation Patient Details Name: Amy Curtis MRN: [id number] DOB: [birth year] Today's Date: 05/07/2022   History of Present Illness 82 y.o. female presents to [facility] hospital on 05/07/2022 for elective R TKA. PMH includes anxiety, cataract, depression, GERD, glaucoma, HTN, hypothyroidism, DMII.   Clinical Impression   Patient admitted for the procedure above.  PTA she lives at home, but family is able to provide the needed assist.  Patient is needing Mod A for lower body ADL and supervision for mobility at a RW level.  Patient has a long handled reacher at home, but will ned to purchase a long handled sponge, and might consider a sock aid.  OT will follow in the acute setting to address deficits, but no post acute OT is anticipated.        Recommendations for follow up therapy are one component of a multi-disciplinary discharge planning process, led by the attending physician.  Recommendations may be updated based on patient status, additional functional criteria and insurance authorization.   Assistance Recommended at Discharge Intermittent Supervision/Assistance  Patient can return home with the following Assist for transportation;Assistance with cooking/housework;A little help with bathing/dressing/bathroom    Functional Status Assessment  Patient has had a recent decline in their functional status and demonstrates the ability to make significant improvements in function in a reasonable and predictable amount of time.  Equipment Recommendations  None recommended by OT    Recommendations for Other Services       Precautions / Restrictions Precautions Precautions: Fall;Knee Precaution Booklet Issued: Yes (comment) Restrictions Weight Bearing Restrictions: Yes RLE Weight Bearing: Weight bearing as tolerated      Mobility Bed Mobility Overal bed mobility: Needs Assistance Bed Mobility: Sit to Supine       Sit to supine: Min assist         Transfers Overall transfer level: Needs assistance Equipment used: Rolling walker (2 wheels) Transfers: Sit to/from Stand Sit to Stand: Supervision, Min guard                  Balance Overall balance assessment: Needs assistance Sitting-balance support: No upper extremity supported, Feet supported Sitting balance-[name]: Good     Standing balance support: Reliant on assistive device for balance Standing balance-[name]: Poor                             ADL either performed or assessed with clinical judgement   ADL       Grooming: Wash/dry hands;Supervision/safety;Standing               Lower Body Dressing: Moderate assistance;Sit to/from stand   Toilet Transfer: Supervision/safety;Rolling walker (2 wheels);Ambulation;Regular Toilet                   Vision Baseline Vision/History: 1 Wears glasses Patient Visual Report: No change from baseline       Perception     Praxis      Pertinent Vitals/Pain Pain Assessment Faces Pain Scale: Hurts even more Pain Location: R knee Pain Descriptors / Indicators: Grimacing Pain Intervention(s): Monitored during session, Patient requesting pain meds-RN notified     Hand Dominance Right   Extremity/Trunk Assessment Upper Extremity Assessment Upper Extremity Assessment: Overall WFL for tasks assessed   Lower Extremity Assessment Lower Extremity Assessment: Defer to PT evaluation RLE Deficits / Details: post-op ROM and strength deficits as expected post-TKA. ankle 4-/5, hip 4/5 grossly   Cervical / Trunk Assessment Cervical /  Trunk Assessment: Normal   Communication Communication Communication: No difficulties   Cognition Arousal/Alertness: Awake/alert Behavior During Therapy: WFL for tasks assessed/performed Overall Cognitive Status: Within Functional Limits for tasks assessed                                       General Comments  VSS on RA    Exercises      Shoulder Instructions      Home Living Family/patient expects to be discharged to:: Private residence Living Arrangements: Alone Available Help at Discharge: Family;Available 24 hours/day Type of Home: Mobile home Home Access: Stairs to enter Entrance Stairs-Number of Steps: 1 Entrance Stairs-Rails: None Home Layout: One level     Bathroom Shower/Tub: Producer, television/film/video: Standard     Home Equipment: Agricultural consultant (2 wheels);Rollator (4 wheels)          Prior Functioning/Environment Prior Level of Function : Independent/Modified Independent             Mobility Comments: ambulates with rollator at baseline ADLs Comments: no assist with ADL or iADL        OT Problem List: Decreased strength;Decreased range of motion;Decreased activity tolerance;Impaired balance (sitting and/or standing);Pain      OT Treatment/Interventions: Self-care/ADL training;Therapeutic activities;Patient/family education;Balance training    OT Goals(Current goals can be found in the care plan section) Acute Rehab OT Goals Patient Stated Goal: Return home OT Goal Formulation: With patient Time For Goal Achievement: 05/21/22 Potential to Achieve Goals: Good ADL Goals Pt Will Perform Grooming: with modified independence;standing Pt Will Perform Lower Body Dressing: with modified independence;sit to/from stand Pt Will Transfer to Toilet: with modified independence;ambulating;regular height toilet  OT Frequency: Min 2X/week    Co-evaluation              AM-PAC OT "6 Clicks" Daily Activity     Outcome Measure Help from another person eating meals?: None Help from another person taking care of personal grooming?: A Little Help from another person toileting, which includes using toliet, bedpan, or urinal?: A Little Help from another person bathing (including washing, rinsing, drying)?: A Lot Help from another person to put on and taking off regular upper body  clothing?: None Help from another person to put on and taking off regular lower body clothing?: A Lot 6 Click Score: 18   End of Session Equipment Utilized During Treatment: Rolling walker (2 wheels) Nurse Communication: Patient requests pain meds  Activity Tolerance: Patient tolerated treatment well Patient left: in bed;with call bell/phone within reach  OT Visit Diagnosis: Unsteadiness on feet (R26.81);Pain Pain - Right/Left: Right Pain - part of body: Knee                Time: 1650-1715 OT Time Calculation (min): 25 min Charges:  OT General Charges $OT Visit: 1 Visit OT Evaluation $OT Eval Moderate Complexity: 1 Mod OT Treatments $Self Care/Home Management : 8-22 mins  05/07/2022  RP, OTR/L  Acute Rehabilitation Services  Office:  [phone]   Suzanna Obey 05/07/2022, 5:21 PM

## 2022-05-07 NOTE — Op Note (Signed)
Pre and postop diagnosis: Right knee primary osteoarthritis  Procedure: Right total knee arthroplasty.  Surgeon: Annell Greening, MD  Assistant: Youlanda Roys, RNFA  Anesthesia: Spinal plus preoperative adductor block plus Exparel and Marcaine 20+20.  Implants:Implants  CEMENT HV SMART SET - [id number]  Inventory Item: CEMENT HV SMART SET Serial no.: Model/Cat no.: [id number]  Implant name: CEMENT HV SMART SET - [id number] Laterality: Right Area: Knee  Manufacturer: [employer] Date of Manufacture:   Action: Implanted Number Used: 2   Device Identifier: Device Identifier Type:   PATELLA MEDIAL ATTUN KNEE - [id number]  Inventory Item: PATELLA MEDIAL ATTUN KNEE Serial no.: Model/Cat no.: [id number]  Implant name: PATELLA MEDIAL ATTUN KNEE - [id number] Laterality: Right Area: Knee  Manufacturer: [employer] Date of Manufacture:   Action: Implanted Number Used: 1   Device Identifier: Device Identifier Type:   ATTUNE PSRP INSR SZ4 5 KNEE - [id number]  Inventory Item: ATTUNE PSRP INSR SZ4 5 KNEE Serial no.: Model/Cat no.: [id number]  Implant name: ATTUNE PSRP INSR SZ4 5 KNEE - [id number] Laterality: Right Area: Knee  Manufacturer: [employer] Date of Manufacture:   Action: Implanted Number Used: 1   Device Identifier: Device Identifier Type:   ATTUNE PS FEM RT SZ 4 CEM KNEE - [id number]  Inventory Item: ATTUNE PS FEM RT SZ 4 CEM KNEE Serial no.: Model/Cat no.: [id number]  Implant name: ATTUNE PS FEM RT SZ 4 CEM KNEE - [id number] Laterality: Right Area: Knee  Manufacturer: [employer] Date of Manufacture:   Action: Implanted Number Used: 1   Device Identifier: Device Identifier Type:   TIBIAL BASE ROT PLAT SZ 5 KNEE - [id number]   Inventory Item: TIBIAL BASE ROT PLAT SZ 5 KNEE Serial no.: Model/Cat no.: [id number]  Implant name: TIBIAL BASE ROT PLAT SZ 5 KNEE - [id number] Laterality: Right Area: Knee  Manufacturer: [employer] Date of  Manufacture:   Action: Implanted Number Used: 1   Device Identifier: Device Identifier Type:   Procedure: After induction of preoperative adductor block spinal anesthesia supine position lateral post heel bump proximal thigh tourniquet prepped with DuraPrep the usual extremity sheets and drapes total knee impervious stockinette Coban sterile skin marker Betadine Steri-Drape was applied.  Preoperative Ancef prophylaxis and IV TXA.  Timeout procedure completed.  Midline incision was made patella was everted.  There was a large flat lateral osteophytes on the patella that extended 2 to 3 cm laterally and was mobile partially attached to the patella.  It was peeled and resected there was severe erosion of the patella tricompartmental arthritis bone-on-bone changes.  Slightly less than 10 mm was resected off the patella to leave enough patella for a patellar implant.  Intramedullary hole drilled in the femur and limb millimeters resected off of the distal femur.  10 off the proximal tibia 5 mm spacer Would fit in between with full extension.  ACL PCL meniscal remnants had been resected.  Trial sizers for femur was #5 tibia was #5.  Chamfer cuts box cut and keel preparation was performed pulsatile lavage vacuum mixing of the cement tibia cemented first followed by femur placement of permanent DePuy attune rotating platform.  All excessive cement was removed and patella was cemented and held with clamp.  While cement was setting up Marcaine Exparel was then injected for postoperative analgesia.  Tourniquet was deflated after 15 minutes hemostasis obtained standard layered closure with #1 Vicryl in the quad tendon split and medial retinaculum 2 and subcutaneous tissue skin staple closure postop  dressing ice machine and a knee immobilizer.  Patient tolerated procedure well transferred to care room in stable condition.

## 2022-05-07 NOTE — Anesthesia Procedure Notes (Signed)
Procedure Name: Intubation Date/Time: 05/07/2022 7:42 AM  Performed by: Montez Morita, An Lannan W, CRNAPre-anesthesia Checklist: Patient identified, Emergency Drugs available, Suction available and Patient being monitored Patient Re-evaluated:Patient Re-evaluated prior to induction Oxygen Delivery Method: Circle system utilized Preoxygenation: Pre-oxygenation with 100% oxygen Induction Type: IV induction Ventilation: Mask ventilation without difficulty Laryngoscope Size: Miller and 2 Grade View: Grade I Tube type: Oral Tube size: 7.0 mm Number of attempts: 1 Airway Equipment and Method: Stylet Placement Confirmation: ETT inserted through vocal cords under direct vision, positive ETCO2 and breath sounds checked- equal and bilateral Secured at: 21 cm Tube secured with: Tape Dental Injury: Teeth and Oropharynx as per pre-operative assessment

## 2022-05-07 NOTE — Evaluation (Signed)
Physical Therapy Evaluation Patient Details Name: Amy Curtis MRN: [id number] DOB: 12/29/1940 Today's Date: 05/07/2022  History of Present Illness  82 y.o. female presents to [facility] hospital on 05/07/2022 for elective R TKA. PMH includes anxiety, cataract, depression, GERD, glaucoma, HTN, hypothyroidism, DMII.  Clinical Impression  Pt presents to PT with deficits in functional mobility, gait, balance, strength, ROM, endurance. Pt is mobilizing well s/p TKA, ambulating to bathroom with support of RW. Pt demonstrates good activation of both quads and hamstrings with introduction of TKA exercise program. PT encourages frequent mobilization and the need to maintain knee extension when resting. PT will follow up tomorrow for further gait and mobility training.       Recommendations for follow up therapy are one component of a multi-disciplinary discharge planning process, led by the attending physician.  Recommendations may be updated based on patient status, additional functional criteria and insurance authorization.  Follow Up Recommendations       Assistance Recommended at Discharge Intermittent Supervision/Assistance  Patient can return home with the following  A little help with walking and/or transfers;A little help with bathing/dressing/bathroom;Assistance with cooking/housework;Assist for transportation;Help with stairs or ramp for entrance    Equipment Recommendations BSC/3in1  Recommendations for Other Services       Functional Status Assessment Patient has had a recent decline in their functional status and demonstrates the ability to make significant improvements in function in a reasonable and predictable amount of time.     Precautions / Restrictions Precautions Precautions: Fall;Knee Precaution Booklet Issued: Yes (comment) Restrictions Weight Bearing Restrictions: Yes RLE Weight Bearing: Weight bearing as tolerated      Mobility  Bed Mobility Overal bed mobility: Needs  Assistance Bed Mobility: Supine to Sit     Supine to sit: Min guard, HOB elevated          Transfers Overall transfer level: Needs assistance Equipment used: Rolling walker (2 wheels) Transfers: Sit to/from Stand Sit to Stand: Min guard           General transfer comment: verbal cues for hand placement. IV placed at wrist crease palmar side on L hand making use arm to push difficult    Ambulation/Gait Ambulation/Gait assistance: Min guard Gait Distance (Feet): 15 Feet (15' x 2) Assistive device: Rolling walker (2 wheels) Gait Pattern/deviations: Step-to pattern Gait velocity: reduced Gait velocity interpretation: <1.8 ft/sec, indicate of risk for recurrent falls   General Gait Details: slowed step-to gait, widened BOS  Stairs            Wheelchair Mobility    Modified Rankin (Stroke Patients Only)       Balance Overall balance assessment: Needs assistance Sitting-balance support: No upper extremity supported, Feet supported Sitting balance-[name]: Good     Standing balance support: Single extremity supported, Reliant on assistive device for balance Standing balance-[name]: Poor                               Pertinent Vitals/Pain Pain Assessment Pain Assessment: Faces Faces Pain Scale: Hurts little more Pain Location: hips Pain Descriptors / Indicators: Aching Pain Intervention(s): Monitored during session    Home Living Family/patient expects to be discharged to:: Private residence Living Arrangements: Alone Available Help at Discharge: Family;Available 24 hours/day (son taking off work for a week starting sunday) Type of Home: Mobile home Home Access: Stairs to enter Entrance Stairs-Rails: None Entrance Stairs-Number of Steps: 1   Home Layout: One level Home  Equipment: Agricultural consultant (2 wheels);Rollator (4 wheels)      Prior Function Prior Level of Function : Independent/Modified Independent              Mobility Comments: ambulates with rollator at baseline       Hand Dominance        Extremity/Trunk Assessment   Upper Extremity Assessment Upper Extremity Assessment: Overall WFL for tasks assessed    Lower Extremity Assessment Lower Extremity Assessment: RLE deficits/detail RLE Deficits / Details: post-op ROM and strength deficits as expected post-TKA. ankle 4-/5, hip 4/5 grossly    Cervical / Trunk Assessment Cervical / Trunk Assessment: Normal  Communication   Communication: No difficulties  Cognition Arousal/Alertness: Awake/alert Behavior During Therapy: WFL for tasks assessed/performed Overall Cognitive Status: Within Functional Limits for tasks assessed                                          General Comments General comments (skin integrity, edema, etc.): VSS on RA    Exercises Other Exercises Other Exercises: PT provides verbal education on TKR exercise packet. Pt performs one rep of each exercise   Assessment/Plan    PT Assessment Patient needs continued PT services  PT Problem List Decreased strength;Decreased range of motion;Decreased activity tolerance;Decreased balance;Decreased mobility;Decreased knowledge of use of DME;Pain       PT Treatment Interventions DME instruction;Gait training;Stair training;Functional mobility training;Therapeutic activities;Therapeutic exercise;Balance training;Neuromuscular re-education;Patient/family education    PT Goals (Current goals can be found in the Care Plan section)  Acute Rehab PT Goals Patient Stated Goal: to go home PT Goal Formulation: With patient Time For Goal Achievement: 05/11/22 Potential to Achieve Goals: Good    Frequency 7X/week     Co-evaluation               AM-PAC PT "6 Clicks" Mobility  Outcome Measure Help needed turning from your back to your side while in a flat bed without using bedrails?: A Little Help needed moving from lying on your back to sitting on  the side of a flat bed without using bedrails?: A Little Help needed moving to and from a bed to a chair (including a wheelchair)?: A Little Help needed standing up from a chair using your arms (e.g., wheelchair or bedside chair)?: A Little Help needed to walk in hospital room?: A Little Help needed climbing 3-5 steps with a railing? : Total 6 Click Score: 16    End of Session   Activity Tolerance: Patient tolerated treatment well Patient left: in chair;with call bell/phone within reach;with chair alarm set Nurse Communication: Mobility status PT Visit Diagnosis: Other abnormalities of gait and mobility (R26.89);Muscle weakness (generalized) (M62.81);Pain Pain - Right/Left: Right Pain - part of body: Knee    Time: 1428-1500 PT Time Calculation (min) (ACUTE ONLY): 32 min   Charges:   PT Evaluation $PT Eval Low Complexity: 1 Low          Arlyss Gandy, PT, DPT Acute Rehabilitation Office [phone]   Arlyss Gandy 05/07/2022, 3:15 PM

## 2022-05-07 NOTE — TOC Initial Note (Signed)
Transition of Care [facility]) - Initial/Assessment Note    Patient Details  Name: Amy Curtis MRN: [id number] Date of Birth: [birth year]  [facility] CM/SW Contact:    Kermit Balo, RN Phone Number: 05/07/2022, 4:23 PM  Clinical Narrative:                 Pt is from home alone but her son is going to stay with her for about a week after surgery.  Pt has all need DME including recommended BSC.  Pt manages her own medications and denies any issues.  Orders for [facility] services. CM has faxed orders to [facility] and await their decision. Fax: [phone] Pts sons will provide needed transport home when medically ready for d/c.   Expected Discharge Plan: Home w Home Health Services Barriers to Discharge: Continued Medical Work up   Patient Goals and CMS Choice   CMS Medicare.gov Compare Post Acute Care list provided to:: Patient Choice offered to / list presented to : Patient      Expected Discharge Plan and Services   Discharge Planning Services: CM Consult Post Acute Care Choice: Home Health Living arrangements for the past 2 months: Single Family Home                           HH Arranged: PT HH Agency: [facility] Date [facility] Agency Contacted: 05/07/22      Prior Living Arrangements/Services Living arrangements for the past 2 months: Single Family Home Lives with:: Self Patient language and need for interpreter reviewed:: Yes          Care giver support system in place?: Yes (comment) Current home services: DME (walker/ rollator/) Criminal Activity/Legal Involvement Pertinent to Current Situation/Hospitalization: No - Comment as needed  Activities of Daily Living Home Assistive Devices/Equipment: Eyeglasses, Dentures (specify type), Hearing aid ADL Screening (condition at time of admission) Patient's cognitive ability adequate to safely complete daily activities?: Yes Is the patient deaf or have difficulty hearing?: No Does the  patient have difficulty seeing, even when wearing glasses/contacts?: No Does the patient have difficulty concentrating, remembering, or making decisions?: No Patient able to express need for assistance with ADLs?: Yes Does the patient have difficulty dressing or bathing?: No Independently performs ADLs?: Yes (appropriate for developmental age) Does the patient have difficulty walking or climbing stairs?: No Weakness of Legs: Both Weakness of Arms/Hands: None  Permission Sought/Granted                  Emotional Assessment Appearance:: Appears stated age Attitude/Demeanor/Rapport: Engaged Affect (typically observed): Accepting Orientation: : Oriented to Self, Oriented to Place, Oriented to  Time, Oriented to Situation   Psych Involvement: No (comment)  Admission diagnosis:  S/P TKR (total knee replacement), right [Z96.651] Patient Active Problem List   Diagnosis Date Noted   S/P TKR (total knee replacement), right 05/07/2022   Unilateral primary osteoarthritis, right knee 04/24/2020   Quadriceps weakness 08/31/2018   Herniated nucleus pulposus, lumbar 07/05/2016   Lumbar herniated disc 07/05/2016   Status post total left knee replacement 03/26/2015   Spinal stenosis, lumbar region, with neurogenic claudication 11/05/2013   Lumbar spinal stenosis 11/05/2013   PCP:  Berneda Rose, FNP Pharmacy:   [phone] - [place], [place] - [address] [place] [address] Phone: [phone] Fax: [phone]  [id number] - [place], [place] - [address] [place] [address] Phone: [phone]  Fax: [phone]     Social Determinants of Health (SDOH) Social History: SDOH Screenings   Food Insecurity: No Food Insecurity (05/07/2022)  Housing: Low Risk  (05/07/2022)  Transportation Needs: No Transportation Needs (05/07/2022)  Utilities: Not At Risk (05/07/2022)  Tobacco Use: Low Risk  (05/07/2022)    SDOH Interventions:     Readmission Risk Interventions     No data to display

## 2022-05-07 NOTE — Interval H&P Note (Signed)
History and Physical Interval Note:  05/07/2022 7:24 AM  Amy Curtis  has presented today for surgery, with the diagnosis of right knee osteoarthritis.  The various methods of treatment have been discussed with the patient and family. After consideration of risks, benefits and other options for treatment, the patient has consented to  Procedure(s) with comments: RIGHT TOTAL KNEE ARTHROPLASTY (Right) - Needs RNFA as a surgical intervention.  The patient's history has been reviewed, patient examined, no change in status, stable for surgery.  I have reviewed the patient's chart and labs.  Questions were answered to the patient's satisfaction.     Eldred Manges

## 2022-05-07 NOTE — H&P (Signed)
TOTAL KNEE ADMISSION H&P  Patient is being admitted for right total knee arthroplasty.  Subjective:  Chief Complaint:right knee pain.  HPI: [place], 82 y.o. female, has a history of pain and functional disability in the right knee due to arthritis and has failed non-surgical conservative treatments for greater than 12 weeks to includeNSAID's and/or analgesics, corticosteriod injections, and use of assistive devices.  Onset of symptoms was gradual, starting 5 years ago with gradually worsening course since that time. The patient noted no past surgery on the right knee(s).  Patient currently rates pain in the right knee(s) at 5 out of 10 with activity. Patient has night pain, worsening of pain with activity and weight bearing, pain that interferes with activities of daily living, and pain with passive range of motion.  Patient has evidence of subchondral cysts, subchondral sclerosis, periarticular osteophytes, joint subluxation, and joint space narrowing by imaging studies. This patient has had  no previous surgery . There is no active infection.  Patient Active Problem List   Diagnosis Date Noted   Unilateral primary osteoarthritis, right knee 04/24/2020   Quadriceps weakness 08/31/2018   Herniated nucleus pulposus, lumbar 07/05/2016   Lumbar herniated disc 07/05/2016   Status post total left knee replacement 03/26/2015   Spinal stenosis, lumbar region, with neurogenic claudication 11/05/2013   Lumbar spinal stenosis 11/05/2013   Past Medical History:  Diagnosis Date   Anxiety    Arthritis    "back, legs, knees" (03/27/2015)   Cataract    left eye   Depression    GERD (gastroesophageal reflux disease)    Glaucoma    R eye only but uses gtts in both eyes   H/O hiatal hernia    HNP (herniated nucleus pulposus)    right lumbar 4-5   HOH (hard of hearing)    Hypertension    Hypothyroidism    Migraines    "stopped when my periods stopped"   PONV (postoperative nausea and  vomiting)    Shortness of breath    Sleep apnea    "tried CPAP; couldn't wear it"    Type II diabetes mellitus    losing weight & careful dietary choices, no Rx (03/27/2015)   Wears dentures    Wears glasses     Past Surgical History:  Procedure Laterality Date   BACK SURGERY     CARPAL TUNNEL RELEASE Right    CATARACT EXTRACTION     COLONOSCOPY     DILATION AND CURETTAGE OF UTERUS  X 2   JOINT REPLACEMENT     LUMBAR DISC SURGERY  2011   done in [facility]   LUMBAR LAMINECTOMY N/A 11/05/2013   Procedure: L3-4 Decompression;  Surgeon: Eldred Manges, MD;  Location: [facility] OR;  Service: Orthopedics;  Laterality: N/A;   LUMBAR LAMINECTOMY Right 07/05/2016   Procedure: RIGHT L-4 HEMILAMINECTOMY, LAMINOTOMY L-5 S-1 ;  Surgeon: Eldred Manges, MD;  Location: [facility] OR;  Service: Orthopedics;  Laterality: Right;   MULTIPLE TOOTH EXTRACTIONS     SHOULDER ARTHROSCOPY W/ ROTATOR CUFF REPAIR Right ?2013   TONSILLECTOMY     TOTAL KNEE ARTHROPLASTY Left 03/26/2015   Procedure: TOTAL KNEE ARTHROPLASTY;  Surgeon: Eldred Manges, MD;  Location: [facility] OR;  Service: Orthopedics;  Laterality: Left;   TUBAL LIGATION      Current Facility-Administered Medications  Medication Dose Route Frequency Provider Last Rate Last Admin   ceFAZolin (ANCEF) IVPB 2g/100 mL premix  2 g Intravenous On Call to OR Persons, [place], [place]  chlorhexidine (PERIDEX) 0.12 % solution 15 mL  15 mL Mouth/Throat Once Beryle Lathe, MD       Or   Oral care mouth rinse  15 mL Mouth Rinse Once Beryle Lathe, MD       fentaNYL (SUBLIMAZE) 100 MCG/2ML injection            lactated ringers infusion   Intravenous Continuous Beryle Lathe, MD       midazolam (VERSED) 2 MG/2ML injection            tranexamic acid (CYKLOKAPRON) IVPB 1,000 mg  1,000 mg Intravenous To OR Persons, [place], [place]       Allergies  Allergen Reactions   Adhesive [Tape] Itching and Rash    HOSPITAL TAPE, Rash did not heal well    Social History   Tobacco Use    Smoking status: Never   Smokeless tobacco: Never  Substance Use Topics   Alcohol use: No    Family History  Problem Relation Age of Onset   Lung disease Mother    Lung disease Father      Review of Systems  Constitutional: Negative.   HENT: Negative.    Eyes:        Glasses  Cardiovascular:  Positive for leg swelling.  Gastrointestinal: Negative.   Genitourinary: Negative.   Musculoskeletal:  Positive for back pain.    Objective:  Physical ExamObjective: Vital Signs: BP 138/76   Ht  (1.6 m)   Wt 214 lb (97.1 kg)   BMI 37.91 kg/m    Physical Exam Constitutional:      Appearance: She is well-developed.  HENT:     Head: Normocephalic.     Right Ear: External ear normal.     Left Ear: External ear normal. There is no impacted cerumen.  Eyes:     Pupils: Pupils are equal, round, and reactive to light.  Neck:     Thyroid: No thyromegaly.     Trachea: No tracheal deviation.  Cardiovascular:     Rate and Rhythm: Normal rate and regular rhythm.  Pulmonary:     Effort: Pulmonary effort is normal.     Breath sounds: Normal breath sounds.     Comments: Lungs are clear at the apices and also at the base right and left.  No rales or rhonchi. Abdominal:     Palpations: Abdomen is soft.  Musculoskeletal:     Cervical back: No rigidity.  Skin:    General: Skin is warm and dry.  Neurological:     Mental Status: She is alert and oriented to person, place, and time.  Psychiatric:        Behavior: Behavior normal.        Ortho Exam right knee crepitus range of motion 10 to 100 degrees.  Collateral ligaments are stable.  Palpable marginal osteophytes.  Negative logroll of the hip.  Pedal pulses palpable she has significant swelling mid calf distal subcutaneously without ecchymosis.  Some tenderness anteriorly over the ankle and anterolateral with negative anterior drawer.  Peroneal is strong no palpable defect in the peroneal tendon.    Vital signs in last 24  hours: Temp:  [97.9 F (36.6 C)] 97.9 F (36.6 C) (04/19 0617) Pulse Rate:  [63] 63 (04/19 0617) Resp:  [17] 17 (04/19 0617) BP: (183)/(66) 183/66 (04/19 0617) SpO2:  [97 %] 97 % (04/19 0617) Weight:  [97.5 kg] 97.5 kg (04/19 0617)  Labs:   Estimated body mass index is  37.49 kg/m as calculated from the following:   Height as of this encounter: 5' 3.5" (1.613 m).   Weight as of this encounter: 97.5 kg.   Imaging Review Plain radiographs demonstrate severe degenerative joint disease of the right knee(s). The overall alignment ismild varus. The bone quality appears to be good for age and reported activity level.      Assessment/Plan:  End stage arthritis, right knee   The patient history, physical examination, clinical judgment of the provider and imaging studies are consistent with end stage degenerative joint disease of the right knee(s) and total knee arthroplasty is deemed medically necessary. The treatment options including medical management, injection therapy arthroscopy and arthroplasty were discussed at length. The risks and benefits of total knee arthroplasty were presented and reviewed. The risks due to aseptic loosening, infection, stiffness, patella tracking problems, thromboembolic complications and other imponderables were discussed. The patient acknowledged the explanation, agreed to proceed with the plan and consent was signed. Patient is being admitted for inpatient treatment for surgery, pain control, PT, OT, prophylactic antibiotics, VTE prophylaxis, progressive ambulation and ADL's and discharge planning. The patient is planning to be discharged home with home health services     Patient's anticipated LOS is less than 2 midnights, meeting these requirements: - Younger than 38 - Lives within 1 hour of care - Has a competent adult at home to recover with post-op recover - NO history of  - Chronic pain requiring opiods  - Diabetes  - Coronary Artery  Disease  - Heart failure  - Heart attack  - Stroke  - DVT/VTE  - Cardiac arrhythmia  - Respiratory Failure/COPD  - Renal failure  - Anemia  - Advanced Liver disease

## 2022-05-08 DIAGNOSIS — Z96651 Presence of right artificial knee joint: Secondary | ICD-10-CM | POA: Diagnosis present

## 2022-05-08 DIAGNOSIS — E119 Type 2 diabetes mellitus without complications: Secondary | ICD-10-CM | POA: Diagnosis present

## 2022-05-08 DIAGNOSIS — M1711 Unilateral primary osteoarthritis, right knee: Secondary | ICD-10-CM | POA: Diagnosis present

## 2022-05-08 DIAGNOSIS — Z96652 Presence of left artificial knee joint: Secondary | ICD-10-CM | POA: Diagnosis present

## 2022-05-08 DIAGNOSIS — I10 Essential (primary) hypertension: Secondary | ICD-10-CM | POA: Diagnosis present

## 2022-05-08 DIAGNOSIS — F32A Depression, unspecified: Secondary | ICD-10-CM | POA: Diagnosis present

## 2022-05-08 DIAGNOSIS — G4730 Sleep apnea, unspecified: Secondary | ICD-10-CM | POA: Diagnosis present

## 2022-05-08 DIAGNOSIS — Z9841 Cataract extraction status, right eye: Secondary | ICD-10-CM | POA: Diagnosis not present

## 2022-05-08 DIAGNOSIS — M5126 Other intervertebral disc displacement, lumbar region: Secondary | ICD-10-CM | POA: Diagnosis present

## 2022-05-08 DIAGNOSIS — G8929 Other chronic pain: Secondary | ICD-10-CM | POA: Diagnosis present

## 2022-05-08 DIAGNOSIS — Z9842 Cataract extraction status, left eye: Secondary | ICD-10-CM | POA: Diagnosis not present

## 2022-05-08 DIAGNOSIS — Z96659 Presence of unspecified artificial knee joint: Secondary | ICD-10-CM

## 2022-05-08 DIAGNOSIS — K219 Gastro-esophageal reflux disease without esophagitis: Secondary | ICD-10-CM | POA: Diagnosis present

## 2022-05-08 DIAGNOSIS — E039 Hypothyroidism, unspecified: Secondary | ICD-10-CM | POA: Diagnosis present

## 2022-05-08 DIAGNOSIS — J449 Chronic obstructive pulmonary disease, unspecified: Secondary | ICD-10-CM | POA: Diagnosis present

## 2022-05-08 DIAGNOSIS — D62 Acute posthemorrhagic anemia: Secondary | ICD-10-CM | POA: Diagnosis not present

## 2022-05-08 DIAGNOSIS — H409 Unspecified glaucoma: Secondary | ICD-10-CM | POA: Diagnosis present

## 2022-05-08 DIAGNOSIS — Z91048 Other nonmedicinal substance allergy status: Secondary | ICD-10-CM | POA: Diagnosis not present

## 2022-05-08 LAB — CBC
HCT: 30.8 % — ABNORMAL LOW (ref 36.0–46.0)
Hemoglobin: 10.4 g/dL — ABNORMAL LOW (ref 12.0–15.0)
MCH: 30.4 pg (ref 26.0–34.0)
MCHC: 33.8 g/dL (ref 30.0–36.0)
MCV: 90.1 fL (ref 80.0–100.0)
Platelets: 187 10*3/uL (ref 150–400)
RBC: 3.42 MIL/uL — ABNORMAL LOW (ref 3.87–5.11)
RDW: 13.2 % (ref 11.5–15.5)
WBC: 13.2 10*3/uL — ABNORMAL HIGH (ref 4.0–10.5)
nRBC: 0 % (ref 0.0–0.2)

## 2022-05-08 LAB — BASIC METABOLIC PANEL
Anion gap: 12 (ref 5–15)
BUN: 24 mg/dL — ABNORMAL HIGH (ref 8–23)
CO2: 26 mmol/L (ref 22–32)
Calcium: 9.8 mg/dL (ref 8.9–10.3)
Chloride: 95 mmol/L — ABNORMAL LOW (ref 98–111)
Creatinine, Ser: 0.75 mg/dL (ref 0.44–1.00)
GFR, Estimated: >60 mL/min (ref >60)
Glucose, Bld: 211 mg/dL — ABNORMAL HIGH (ref 70–99)
Potassium: 3.5 mmol/L (ref 3.5–5.1)
Sodium: 133 mmol/L — ABNORMAL LOW (ref 135–145)

## 2022-05-08 NOTE — Progress Notes (Signed)
Physical Therapy Treatment Patient Details Name: Amy Curtis MRN: [id number] DOB: 09/23/1940 Today's Date: 05/08/2022   History of Present Illness 82 y.o. female presents to [facility] hospital on 05/07/2022 for elective R TKA. PMH includes anxiety, cataract, depression, GERD, glaucoma, HTN, hypothyroidism, DMII.    PT Comments    Pt with slow progress towards acute goals this session due to increased pain and dizziness/lightheadedness with increased mobility. Pt able to complete bed mobility with min A to manage RLE and to scoot out to EOB and rise to standing with min A to power up and steady with RW support. Pt needing increased assist, up to mod A prevent LOB during short gait distance from EOB>bathroom as pt with R knee buckling during L swing phase and pt with UE weakness/fatigue. Pt becoming dizzy post void, able to stand pivot to recliner with min A, BP 120/42 and RN aware, symptoms resolving with seated rest. Current plan remains appropriate to address deficits and maximize functional independence pending pt progress. Plan to see pt for PM session to continue to progress mobility as able. Pt continues to benefit from skilled PT services to progress toward functional mobility goals.    Recommendations for follow up therapy are one component of a multi-disciplinary discharge planning process, led by the attending physician.  Recommendations may be updated based on patient status, additional functional criteria and insurance authorization.  Follow Up Recommendations       Assistance Recommended at Discharge Intermittent Supervision/Assistance  Patient can return home with the following A little help with walking and/or transfers;A little help with bathing/dressing/bathroom;Assistance with cooking/housework;Assist for transportation;Help with stairs or ramp for entrance   Equipment Recommendations  BSC/3in1    Recommendations for Other Services       Precautions / Restrictions  Precautions Precautions: Fall;Knee Precaution Booklet Issued: Yes (comment) Restrictions Weight Bearing Restrictions: Yes RLE Weight Bearing: Weight bearing as tolerated     Mobility  Bed Mobility Overal bed mobility: Needs Assistance Bed Mobility: Supine to Sit     Supine to sit: HOB elevated, Min assist     General bed mobility comments: min A to manage RLE and to scoot out to EOB    Transfers Overall transfer level: Needs assistance Equipment used: Rolling walker (2 wheels) Transfers: Sit to/from Stand, Bed to chair/wheelchair/BSC Sit to Stand: Min assist   Step pivot transfers: Min assist       General transfer comment: verbal cues for hand placement. min A to rise and steady    Ambulation/Gait Ambulation/Gait assistance: Min assist, Mod assist Gait Distance (Feet): 15 Feet Assistive device: Rolling walker (2 wheels) Gait Pattern/deviations: Step-to pattern Gait velocity: reduced     General Gait Details: slow step to gait, heavy reliance on UEs, R knee buckling and trunk flexion during swing phase on L with pt unable to push up through UEs sufficiently. mod A to steady and prevent LOB   Stairs             Wheelchair Mobility    Modified Rankin (Stroke Patients Only)       Balance Overall balance assessment: Needs assistance Sitting-balance support: No upper extremity supported, Feet supported Sitting balance-[name]: Good     Standing balance support: Reliant on assistive device for balance Standing balance-[name]: Poor                              Cognition Arousal/Alertness: Awake/alert Behavior During Therapy: WFL for  tasks assessed/performed, Impulsive Overall Cognitive Status: Within Functional Limits for tasks assessed                                 General Comments: pt needing increased cues for safety        Exercises Other Exercises Other Exercises: reviewed no pillow under knee     General Comments General comments (skin integrity, edema, etc.): pt with c/o dizziness and lightheadedness after BR use, pt able to pivot to chair and rolled from BR to room, BP 120/42, RN aware      Pertinent Vitals/Pain Pain Assessment Pain Assessment: Faces Faces Pain Scale: Hurts whole lot Pain Location: R knee Pain Descriptors / Indicators: Grimacing Pain Intervention(s): Monitored during session, Limited activity within patient's tolerance, Repositioned    Home Living                          Prior Function            PT Goals (current goals can now be found in the care plan section) Acute Rehab PT Goals Patient Stated Goal: to go home PT Goal Formulation: With patient Time For Goal Achievement: 05/11/22 Progress towards PT goals: Not progressing toward goals - comment (pain and dizziness)    Frequency    7X/week      PT Plan      Co-evaluation              AM-PAC PT "6 Clicks" Mobility   Outcome Measure  Help needed turning from your back to your side while in a flat bed without using bedrails?: A Little Help needed moving from lying on your back to sitting on the side of a flat bed without using bedrails?: A Little Help needed moving to and from a bed to a chair (including a wheelchair)?: A Little Help needed standing up from a chair using your arms (e.g., wheelchair or bedside chair)?: A Little Help needed to walk in hospital room?: A Lot Help needed climbing 3-5 steps with a railing? : Total 6 Click Score: 15    End of Session Equipment Utilized During Treatment: Gait belt Activity Tolerance: Patient tolerated treatment well Patient left: in chair;with call bell/phone within reach;with chair alarm set Nurse Communication: Mobility status;Other (comment) (BP) PT Visit Diagnosis: Other abnormalities of gait and mobility (R26.89);Muscle weakness (generalized) (M62.81);Pain Pain - Right/Left: Right Pain - part of body: Knee     Time:  1610-9604 PT Time Calculation (min) (ACUTE ONLY): 31 min  Charges:  $Gait Training: 8-22 mins $Therapeutic Activity: 8-22 mins                     Aaiden Depoy R. PTA Acute Rehabilitation Services Office: [phone]    Catalina Antigua 05/08/2022, 11:15 AM

## 2022-05-08 NOTE — Progress Notes (Signed)
Subjective: 1 Day Post-Op Procedure(s) (LRB): RIGHT TOTAL KNEE ARTHROPLASTY (Right) Patient reports pain as mild.  Comfortable in CPM.   Objective: Vital signs in last 24 hours: Temp:  [97.7 F (36.5 C)-98.6 F (37 C)] 98.4 F (36.9 C) (04/20 0513) Pulse Rate:  [58-85] 76 (04/20 0513) Resp:  [10-18] 18 (04/20 0513) BP: (102-149)/(39-69) 110/63 (04/20 0513) SpO2:  [93 %-100 %] 95 % (04/20 0513)  Intake/Output from previous day: 04/19 0701 - 04/20 0700 In: 1040 [P.O.:240; I.V.:600; IV Piggyback:200] Out: 100 [Blood:100] Intake/Output this shift: No intake/output data recorded.  Recent Labs    05/08/22 0201  HGB 10.4*   Recent Labs    05/08/22 0201  WBC 13.2*  RBC 3.42*  HCT 30.8*  PLT 187   Recent Labs    05/08/22 0201  NA 133*  K 3.5  CL 95*  CO2 26  BUN 24*  CREATININE 0.75  GLUCOSE 211*  CALCIUM 9.8   No results for input(s): "LABPT", "INR" in the last 72 hours.  Sensation intact distally Incision: scant drainage Compartment soft   Assessment/Plan: 1 Day Post-Op Procedure(s) (LRB): RIGHT TOTAL KNEE ARTHROPLASTY (Right) Up with therapy Hopeful for discharge over next few days.     Amy Curtis 05/08/2022, 8:18 AM

## 2022-05-08 NOTE — Progress Notes (Signed)
Orthopedic Tech Progress Note Patient Details:  Amy Curtis [birth year] [phone] CPM will be removed at 11:40am. CPM Right Knee CPM Right Knee: On Right Knee Flexion (Degrees): 60 Right Knee Extension (Degrees): 0  Post Interventions Patient Tolerated: Well  Amy Curtis 05/08/2022, 7:54 AM

## 2022-05-08 NOTE — Progress Notes (Signed)
Physical Therapy Treatment Patient Details Name: Amy Curtis MRN: [id number] DOB: Oct 12, 1940 Today's Date: 05/08/2022   History of Present Illness 82 y.o. female presents to [facility] hospital on 05/07/2022 for elective R TKA. PMH includes anxiety, cataract, depression, GERD, glaucoma, HTN, hypothyroidism, DMII.    PT Comments    Pt with slow progress towards acute goals this session due to continued pain, fatigue and general weakness. Pt able to progress gait short distance with RW support and min A to steady as pt continues to have R knee buckling in stance with poor UE strength. Despite slow progress pt putting forth full effort and motivated to return to PLOF. Pt able to complete LE therex with cues for technique and receptive to continued education on importance of knee extension with yellow foam placed at calf at end of session. Pt continues to benefit from skilled PT services to progress toward functional mobility goals.    Recommendations for follow up therapy are one component of a multi-disciplinary discharge planning process, led by the attending physician.  Recommendations may be updated based on patient status, additional functional criteria and insurance authorization.  Follow Up Recommendations       Assistance Recommended at Discharge Intermittent Supervision/Assistance  Patient can return home with the following A little help with walking and/or transfers;A little help with bathing/dressing/bathroom;Assistance with cooking/housework;Assist for transportation;Help with stairs or ramp for entrance   Equipment Recommendations  BSC/3in1    Recommendations for Other Services       Precautions / Restrictions Precautions Precautions: Fall;Knee Precaution Booklet Issued: Yes (comment) Restrictions Weight Bearing Restrictions: Yes RLE Weight Bearing: Weight bearing as tolerated     Mobility  Bed Mobility Overal bed mobility: Needs Assistance Bed Mobility: Sit to Supine      Supine to sit: HOB elevated, Min assist Sit to supine: Min assist   General bed mobility comments: min A to manage RLE    Transfers Overall transfer level: Needs assistance Equipment used: Rolling walker (2 wheels) Transfers: Sit to/from Stand, Bed to chair/wheelchair/BSC Sit to Stand: Min assist   Step pivot transfers: Min assist       General transfer comment: verbal cues for hand placement. min A to rise and steady    Ambulation/Gait Ambulation/Gait assistance: Min assist Gait Distance (Feet): 15 Feet (x2) Assistive device: Rolling walker (2 wheels) Gait Pattern/deviations: Step-to pattern Gait velocity: reduced     General Gait Details: slow step to gait, heavy reliance on UEs, R knee buckling and pt flexing trunk during swing phase on L despite max multimodal cues   Stairs             Wheelchair Mobility    Modified Rankin (Stroke Patients Only)       Balance Overall balance assessment: Needs assistance Sitting-balance support: No upper extremity supported, Feet supported Sitting balance-[name]: Good     Standing balance support: Reliant on assistive device for balance Standing balance-[name]: Poor                              Cognition Arousal/Alertness: Awake/alert Behavior During Therapy: WFL for tasks assessed/performed, Impulsive Overall Cognitive Status: Within Functional Limits for tasks assessed                                 General Comments: pt needing increased cues for safety  Exercises Total Joint Exercises Ankle Circles/Pumps: AROM, Right, Left, 10 reps, Supine Quad Sets: AROM, Right, 10 reps Heel Slides: AROM, Right, 10 reps Hip ABduction/ADduction: AAROM, Right, 10 reps Other Exercises Other Exercises: reviewed no pillow under knee, as pt with yellow foam under knee on arrival    General Comments General comments (skin integrity, edema, etc.): VSS on RA      Pertinent  Vitals/Pain Pain Assessment Pain Assessment: Faces Faces Pain Scale: Hurts even more Pain Location: R knee Pain Descriptors / Indicators: Grimacing Pain Intervention(s): Monitored during session, Limited activity within patient's tolerance, Repositioned, Ice applied    Home Living                          Prior Function            PT Goals (current goals can now be found in the care plan section) Acute Rehab PT Goals Patient Stated Goal: to go home PT Goal Formulation: With patient Time For Goal Achievement: 05/11/22 Progress towards PT goals: Progressing toward goals    Frequency    7X/week      PT Plan      Co-evaluation              AM-PAC PT "6 Clicks" Mobility   Outcome Measure  Help needed turning from your back to your side while in a flat bed without using bedrails?: A Little Help needed moving from lying on your back to sitting on the side of a flat bed without using bedrails?: A Little Help needed moving to and from a bed to a chair (including a wheelchair)?: A Little Help needed standing up from a chair using your arms (e.g., wheelchair or bedside chair)?: A Little Help needed to walk in hospital room?: A Lot Help needed climbing 3-5 steps with a railing? : Total 6 Click Score: 15    End of Session Equipment Utilized During Treatment: Gait belt Activity Tolerance: Patient tolerated treatment well Patient left: with call bell/phone within reach;in bed Nurse Communication: Mobility status PT Visit Diagnosis: Other abnormalities of gait and mobility (R26.89);Muscle weakness (generalized) (M62.81);Pain Pain - Right/Left: Right Pain - part of body: Knee     Time: 1610-9604 PT Time Calculation (min) (ACUTE ONLY): 26 min  Charges:  $Gait Training: 8-22 mins $Therapeutic Exercise: 8-22 mins                     Ananya Mccleese R. PTA Acute Rehabilitation Services Office: [phone]    Catalina Antigua 05/08/2022, 3:31 PM

## 2022-05-08 NOTE — Progress Notes (Signed)
Patient stated that her knee popped when she was trying to get out of bathroom last evening at around 7 pm, she has been complaining of pain in her knee since then. Patient has normal neurovascular check. Got her PRN pain medicines 2 times over night. Patient resting comfortably in the morning and said doesn't need any PRN.

## 2022-05-09 LAB — CBC
HCT: 27.2 % — ABNORMAL LOW (ref 36.0–46.0)
Hemoglobin: 9.4 g/dL — ABNORMAL LOW (ref 12.0–15.0)
MCH: 30.9 pg (ref 26.0–34.0)
MCHC: 34.6 g/dL (ref 30.0–36.0)
MCV: 89.5 fL (ref 80.0–100.0)
Platelets: 164 10*3/uL (ref 150–400)
RBC: 3.04 MIL/uL — ABNORMAL LOW (ref 3.87–5.11)
RDW: 13.4 % (ref 11.5–15.5)
WBC: 9 10*3/uL (ref 4.0–10.5)
nRBC: 0 % (ref 0.0–0.2)

## 2022-05-09 NOTE — Progress Notes (Signed)
Subjective: 2 Days Post-Op Procedure(s) (LRB): RIGHT TOTAL KNEE ARTHROPLASTY (Right) Patient reports pain as severe with activity.  Slow progress with PT. Denies dizziness or being lightheaded .   Objective: Vital signs in last 24 hours: Temp:  [98.1 F (36.7 C)-98.7 F (37.1 C)] 98.7 F (37.1 C) (04/21 0804) Pulse Rate:  [71-88] 88 (04/21 0804) Resp:  [16-18] 18 (04/21 0804) BP: (119-165)/(50-59) 124/53 (04/21 0804) SpO2:  [93 %-97 %] 95 % (04/21 0804)  Intake/Output from previous day: 04/20 0701 - 04/21 0700 In: 720 [P.O.:720] Out: -  Intake/Output this shift: No intake/output data recorded.  Recent Labs    05/08/22 0201 05/09/22 0245  HGB 10.4* 9.4*   Recent Labs    05/08/22 0201 05/09/22 0245  WBC 13.2* 9.0  RBC 3.42* 3.04*  HCT 30.8* 27.2*  PLT 187 164   Recent Labs    05/08/22 0201  NA 133*  K 3.5  CL 95*  CO2 26  BUN 24*  CREATININE 0.75  GLUCOSE 211*  CALCIUM 9.8   No results for input(s): "LABPT", "INR" in the last 72 hours.  Right lower extremity: Incision: scant drainage Calf supple  Dorsiflexion intact   Assessment/Plan: 2 Days Post-Op Procedure(s) (LRB): RIGHT TOTAL KNEE ARTHROPLASTY (Right) Acute blood loss anemia secondary to surgery vitals stable. Monitor for symptoms of anemia.  Up with therapy  Discharge sometime this week as patient progresses with PT. Plans on discharging home with son for support.      Amy Curtis 05/09/2022, 8:31 AM

## 2022-05-09 NOTE — Progress Notes (Addendum)
Physical Therapy Treatment Patient Details Name: Amy Curtis MRN: [id number] DOB: [birth year] Today's Date: 05/09/2022   History of Present Illness 82 y.o. female presents to [facility] hospital on 05/07/2022 for elective R TKA. PMH includes anxiety, cataract, depression, GERD, glaucoma, HTN, hypothyroidism, DMII.    PT Comments    Pt progressing slowly towards her physical therapy goals. Pt seems very anxious and upset as PT enters room; pt reporting various complaints overnight. PT provided active listening and attended to needs as able. Pt requiring min assist for transfers and ambulating limited room distances with RW. Reviewed seated therapeutic exercises. Will return for second session for progressive gait trial.    Recommendations for follow up therapy are one component of a multi-disciplinary discharge planning process, led by the attending physician.  Recommendations may be updated based on patient status, additional functional criteria and insurance authorization.  Follow Up Recommendations       Assistance Recommended at Discharge Intermittent Supervision/Assistance  Patient can return home with the following A little help with walking and/or transfers;A little help with bathing/dressing/bathroom;Assistance with cooking/housework;Assist for transportation;Help with stairs or ramp for entrance   Equipment Recommendations  BSC/3in1    Recommendations for Other Services       Precautions / Restrictions Precautions Precautions: Fall;Knee Precaution Booklet Issued: Yes (comment) Restrictions Weight Bearing Restrictions: Yes RLE Weight Bearing: Weight bearing as tolerated     Mobility  Bed Mobility Overal bed mobility: Needs Assistance Bed Mobility: Supine to Sit       Sit to supine: Min assist   General bed mobility comments: min A to manage RLE    Transfers Overall transfer level: Needs assistance Equipment used: Rolling walker (2 wheels) Transfers: Sit to/from  Stand Sit to Stand: Min assist           General transfer comment: MinA to rise and steady    Ambulation/Gait Ambulation/Gait assistance: Min assist Gait Distance (Feet): 6 Feet (3", 3") Assistive device: Rolling walker (2 wheels) Gait Pattern/deviations: Step-to pattern Gait velocity: reduced     General Gait Details: slow step to gait, heavy reliance on UEs, R knee buckling. Pivotal steps from bed > BSC and then [facility] > recliner   Stairs             Wheelchair Mobility    Modified Rankin (Stroke Patients Only)       Balance Overall balance assessment: Needs assistance Sitting-balance support: No upper extremity supported, Feet supported Sitting balance-[name]: Good     Standing balance support: Reliant on assistive device for balance Standing balance-[name]: Poor                              Cognition Arousal/Alertness: Awake/alert Behavior During Therapy: WFL for tasks assessed/performed, Impulsive Overall Cognitive Status: Within Functional Limits for tasks assessed                                 General Comments: pt needing increased cues for safety        Exercises Total Joint Exercises Ankle Circles/Pumps: AROM, Both, 20 reps, Seated Heel Slides: AROM, Right, 5 reps, Seated Hip ABduction/ADduction: 10 reps, Both, AROM Long Arc Quad: AAROM, Right, 10 reps, Seated Goniometric ROM: 3-80 degrees    General Comments        Pertinent Vitals/Pain Pain Assessment Pain Assessment: Faces Faces Pain Scale: Hurts even more Pain Location:  R knee Pain Descriptors / Indicators: Grimacing Pain Intervention(s): Monitored during session, Limited activity within patient's tolerance, Ice applied    Home Living                          Prior Function            PT Goals (current goals can now be found in the care plan section) Acute Rehab PT Goals Patient Stated Goal: to go home PT Goal Formulation:  With patient Time For Goal Achievement: 05/11/22    Frequency    7X/week      PT Plan Current plan remains appropriate    Co-evaluation              AM-PAC PT "6 Clicks" Mobility   Outcome Measure  Help needed turning from your back to your side while in a flat bed without using bedrails?: A Little Help needed moving from lying on your back to sitting on the side of a flat bed without using bedrails?: A Little Help needed moving to and from a bed to a chair (including a wheelchair)?: A Little Help needed standing up from a chair using your arms (e.g., wheelchair or bedside chair)?: A Little Help needed to walk in hospital room?: A Lot Help needed climbing 3-5 steps with a railing? : Total 6 Click Score: 15    End of Session Equipment Utilized During Treatment: Gait belt Activity Tolerance: Patient tolerated treatment well Patient left: with call bell/phone within reach;in bed Nurse Communication: Mobility status PT Visit Diagnosis: Other abnormalities of gait and mobility (R26.89);Muscle weakness (generalized) (M62.81);Pain Pain - Right/Left: Right Pain - part of body: Knee     Time: 1610-9604 PT Time Calculation (min) (ACUTE ONLY): 43 min  Charges:  $Therapeutic Exercise: 8-22 mins $Therapeutic Activity: 23-37 mins                     Lillia Pauls, PT, DPT Acute Rehabilitation Services Office [phone]    Amy Curtis 05/09/2022, 8:57 AM

## 2022-05-09 NOTE — Progress Notes (Signed)
Physical Therapy Treatment Patient Details Name: Amy Curtis MRN: [id number] DOB: Feb 15, 1940 Today's Date: 05/09/2022   History of Present Illness 82 y.o. female presents to [facility] hospital on 05/07/2022 for elective R TKA. PMH includes anxiety, cataract, depression, GERD, glaucoma, HTN, hypothyroidism, DMII.    PT Comments    Pt progressing somewhat towards her physical therapy goals this afternoon. Ambulating 25 ft with a walker, utilizing a step to pattern with heavy reliance through arms on walker and crouched posture. Pt unable to achieve upright positioning with cueing. Fatigues quickly. Of note, pt still also requiring assist for peri care after toileting. Will continue to progress as tolerated.    Recommendations for follow up therapy are one component of a multi-disciplinary discharge planning process, led by the attending physician.  Recommendations may be updated based on patient status, additional functional criteria and insurance authorization.  Follow Up Recommendations       Assistance Recommended at Discharge Intermittent Supervision/Assistance  Patient can return home with the following A little help with walking and/or transfers;A little help with bathing/dressing/bathroom;Assistance with cooking/housework;Assist for transportation;Help with stairs or ramp for entrance   Equipment Recommendations  BSC/3in1    Recommendations for Other Services       Precautions / Restrictions Precautions Precautions: Fall;Knee Precaution Booklet Issued: Yes (comment) Restrictions Weight Bearing Restrictions: Yes RLE Weight Bearing: Weight bearing as tolerated     Mobility  Bed Mobility Overal bed mobility: Needs Assistance Bed Mobility: Sit to Supine       Sit to supine: Min assist   General bed mobility comments: min A to manage RLE    Transfers Overall transfer level: Needs assistance Equipment used: Rolling walker (2 wheels) Transfers: Sit to/from Stand Sit to  Stand: Min assist, Min guard           General transfer comment: MinA due to decreased eccentric control    Ambulation/Gait Ambulation/Gait assistance: Min guard Gait Distance (Feet): 25 Feet Assistive device: Rolling walker (2 wheels) Gait Pattern/deviations: Step-to pattern Gait velocity: reduced     General Gait Details: slow step to gait, heavy reliance on UEs. unable to achieve significant trunk/hip extension with cueing   Stairs             Wheelchair Mobility    Modified Rankin (Stroke Patients Only)       Balance Overall balance assessment: Needs assistance Sitting-balance support: No upper extremity supported, Feet supported Sitting balance-[name]: Good     Standing balance support: Reliant on assistive device for balance Standing balance-[name]: Poor                              Cognition Arousal/Alertness: Awake/alert Behavior During Therapy: WFL for tasks assessed/performed, Impulsive Overall Cognitive Status: Within Functional Limits for tasks assessed                                 General Comments: pt needing increased cues for safety        Exercises Total Joint Exercises Ankle Circles/Pumps: AROM, Both, 20 reps, Seated Quad Sets: Right, 10 reps, Seated Heel Slides: AROM, Right, 5 reps, Seated Hip ABduction/ADduction: 10 reps, Both, AROM Long Arc Quad: AAROM, Right, 10 reps, Seated Goniometric ROM: 3-80 degrees    General Comments        Pertinent Vitals/Pain Pain Assessment Pain Assessment: Faces Faces Pain Scale: Hurts little more Pain  Location: R knee Pain Descriptors / Indicators: Grimacing Pain Intervention(s): Monitored during session    Home Living                          Prior Function            PT Goals (current goals can now be found in the care plan section) Acute Rehab PT Goals Patient Stated Goal: to go home PT Goal Formulation: With patient Time For  Goal Achievement: 05/11/22 Potential to Achieve Goals: Good    Frequency    7X/week      PT Plan Current plan remains appropriate    Co-evaluation              AM-PAC PT "6 Clicks" Mobility   Outcome Measure  Help needed turning from your back to your side while in a flat bed without using bedrails?: A Little Help needed moving from lying on your back to sitting on the side of a flat bed without using bedrails?: A Little Help needed moving to and from a bed to a chair (including a wheelchair)?: A Little Help needed standing up from a chair using your arms (e.g., wheelchair or bedside chair)?: A Little Help needed to walk in hospital room?: A Little Help needed climbing 3-5 steps with a railing? : Total 6 Click Score: 16    End of Session Equipment Utilized During Treatment: Gait belt Activity Tolerance: Patient tolerated treatment well Patient left: with call bell/phone within reach;in bed Nurse Communication: Mobility status PT Visit Diagnosis: Other abnormalities of gait and mobility (R26.89);Muscle weakness (generalized) (M62.81);Pain Pain - Right/Left: Right Pain - part of body: Knee     Time: 4098-1191 PT Time Calculation (min) (ACUTE ONLY): 25 min  Charges:  $Gait Training: 8-22 mins $Therapeutic Activity: 8-22 mins                     Lillia Pauls, PT, DPT Acute Rehabilitation Services Office [phone]    Amy Curtis 05/09/2022, 3:52 PM

## 2022-05-10 ENCOUNTER — Telehealth: Payer: Self-pay | Admitting: Orthopaedic Surgery

## 2022-05-10 ENCOUNTER — Encounter (HOSPITAL_COMMUNITY): Payer: Self-pay | Admitting: Orthopaedic Surgery

## 2022-05-10 DIAGNOSIS — D62 Acute posthemorrhagic anemia: Secondary | ICD-10-CM | POA: Diagnosis not present

## 2022-05-10 NOTE — Telephone Encounter (Signed)
Amy Curtis from [facility] called. Would like to know if Dr. Ophelia Charter will be signing off on her home health orders? Her call back number is [phone]

## 2022-05-10 NOTE — Progress Notes (Signed)
Orthopedic Tech Progress Note Patient Details:  Amy Curtis April 11, 1940 [id number]  Ortho Devices Type of Ortho Device: Knee Immobilizer Ortho Device/Splint Location: RLE Ortho Device/Splint Interventions: Ordered, Application, Adjustment   Post Interventions Patient Tolerated: Well Instructions Provided: Care of device  [name] 05/10/2022, 6:46 PM

## 2022-05-10 NOTE — TOC Transition Note (Signed)
Transition of Care [facility]) - CM/SW Discharge Note   Patient Details  Name: Amy Curtis MRN: [id number] Date of Birth: 1940-11-22  Transition of Care [facility]) CM/SW Contact:  Tom-Johnson, Hershal Coria, RN Phone Number: 05/10/2022, 4:03 PM   Clinical Narrative:     Patient is scheduled for discharge today.  Readmission Prevention Assessment done.  Hospital f/u and discharge instructions on AVS.  Home health info on AVS, d/c summary and H&P faxed to Coralee North at [facility] [phone]). Son, [name] to transport at discharge.  No further TOC needs noted.         Final next level of care: Home w Home Health Services Barriers to Discharge: Barriers Resolved   Patient Goals and CMS Choice CMS Medicare.gov Compare Post Acute Care list provided to:: Patient Choice offered to / list presented to : Patient, Adult Children (Son, [name])  Discharge Placement                  Patient to be transferred to facility by: Son Name of family member notified: Macie Burows    Discharge Plan and Services Additional resources added to the After Visit Summary for     Discharge Planning Services: CM Consult Post Acute Care Choice: Home Health          DME Arranged: N/A (Has necessary DME's at home.) DME Agency: NA       HH Arranged: PT HH Agency: [facility] Date [facility] Agency Contacted: 05/07/22      Social Determinants of Health (SDOH) Interventions SDOH Screenings   Food Insecurity: No Food Insecurity (05/07/2022)  Housing: Low Risk  (05/07/2022)  Transportation Needs: No Transportation Needs (05/07/2022)  Utilities: Not At Risk (05/07/2022)  Tobacco Use: Low Risk  (05/10/2022)     Readmission Risk Interventions    05/10/2022    4:02 PM  Readmission Risk Prevention Plan  Post Dischage Appt Complete  Medication Screening Complete  Transportation Screening Complete

## 2022-05-10 NOTE — Progress Notes (Signed)
Physical Therapy Treatment Patient Details Name: Amy Curtis MRN: [id number] DOB: 22-Jul-1940 Today's Date: 05/10/2022   History of Present Illness 82 y.o. female presents to [facility] hospital on 05/07/2022 for elective R TKA. PMH includes anxiety, cataract, depression, GERD, glaucoma, HTN, hypothyroidism, DMII.    PT Comments    Continuing work on functional mobility and activity tolerance;  Overall solid progress with progressive amb; able to walk household distance with RW, and no overt R knee buckling in stance;   I'm concerned about the one step to enter her home -- we practiced the one step, and pt's R knee buckled as she stepped up and down of fof the step with the LLE; Needed Max assist to stabilize and steady; We discussed other options for negotiating this step, and with her son's close guard and assist, I do believe pt can still get home today -- plan for second session close to 1pm, when her son can join [name]; Another option can be to get a knee immobilizer for her to use for going up and down the step, but I'm hopeful that her R knee will show more stabiltiy next session   Recommendations for follow up therapy are one component of a multi-disciplinary discharge planning process, led by the attending physician.  Recommendations may be updated based on patient status, additional functional criteria and insurance authorization.  Follow Up Recommendations       Assistance Recommended at Discharge Intermittent Supervision/Assistance  Patient can return home with the following A little help with walking and/or transfers;A little help with bathing/dressing/bathroom;Assistance with cooking/housework;Assist for transportation;Help with stairs or ramp for entrance   Equipment Recommendations  BSC/3in1    Recommendations for Other Services       Precautions / Restrictions Precautions Precautions: Fall;Knee Precaution Booklet Issued: Yes (comment) Restrictions Weight Bearing Restrictions:  Yes RLE Weight Bearing: Weight bearing as tolerated     Mobility  Bed Mobility                    Transfers Overall transfer level: Needs assistance Equipment used: Rolling walker (2 wheels) Transfers: Sit to/from Stand Sit to Stand: Supervision   Step pivot transfers: Supervision       General transfer comment: Good use of bil armrests for rise; cues for poistioning for comfort during transition    Ambulation/Gait Ambulation/Gait assistance: Min guard Gait Distance (Feet): 50 Feet (with 3 standing reset breaks) Assistive device: Rolling walker (2 wheels) Gait Pattern/deviations: Step-to pattern       General Gait Details: slow step to gait, heavy reliance on UEs; cues to activate R quad for stance stabiltiy   Stairs Stairs: Yes Stairs assistance: Max assist Stair Management: No rails, Forwards, With walker Number of Stairs: 1 General stair comments: Gave pt demo of going up step forwards and backwards, and described rationale for backwards as pt can keep body closer to the support of RW when she goes up; pt opted to try goin up forward first; her R knee buckled as she stepped up with LLE, and she needed max assist to steady and stabilize; R knee buckled stepping down as well; We then discussed other options for getting up the one step to enter -- including having a chair set up sideways at the edge of teh step, and pt backing to it, and sitting down tothe preset chair; then she can turn her body around, and stand up from the chair into her house   Wheelchair Mobility    Modified Rankin (  Stroke Patients Only)       Balance     Sitting balance-[name]: Good       Standing balance-[name]: Poor                              Cognition Arousal/Alertness: Awake/alert Behavior During Therapy: WFL for tasks assessed/performed Overall Cognitive Status: Within Functional Limits for tasks assessed                                           Exercises Other Exercises Other Exercises: reviewed no pillow under knee, as pt with yellow foam under knee on arrival    General Comments General comments (skin integrity, edema, etc.): Discussed preference fo rher son to join [name] for second PT session      Pertinent Vitals/Pain Pain Assessment Pain Assessment: Faces Faces Pain Scale: Hurts even more Pain Location: R knee Pain Descriptors / Indicators: Grimacing, Sharp, Sore Pain Intervention(s): Monitored during session    Home Living                          Prior Function            PT Goals (current goals can now be found in the care plan section) Acute Rehab PT Goals Patient Stated Goal: to go home PT Goal Formulation: With patient Time For Goal Achievement: 05/11/22 Potential to Achieve Goals: Good Progress towards PT goals: Progressing toward goals    Frequency    7X/week      PT Plan Current plan remains appropriate    Co-evaluation              AM-PAC PT "6 Clicks" Mobility   Outcome Measure  Help needed turning from your back to your side while in a flat bed without using bedrails?: A Little Help needed moving from lying on your back to sitting on the side of a flat bed without using bedrails?: A Little Help needed moving to and from a bed to a chair (including a wheelchair)?: A Little Help needed standing up from a chair using your arms (e.g., wheelchair or bedside chair)?: A Little Help needed to walk in hospital room?: A Little Help needed climbing 3-5 steps with a railing? : A Lot 6 Click Score: 17    End of Session Equipment Utilized During Treatment: Gait belt Activity Tolerance: Patient tolerated treatment well Patient left: with call bell/phone within reach;in bed Nurse Communication: Mobility status PT Visit Diagnosis: Other abnormalities of gait and mobility (R26.89);Muscle weakness (generalized) (M62.81);Pain Pain - Right/Left: Right Pain - part of  body: Knee     Time: 1610-9604 PT Time Calculation (min) (ACUTE ONLY): 42 min  Charges:  $Gait Training: 23-37 mins $Therapeutic Activity: 8-22 mins                     Amy Curtis, PT  Acute Rehabilitation Services Office [phone]    Amy Curtis 05/10/2022, 11:48 AM

## 2022-05-10 NOTE — Progress Notes (Addendum)
Patient ID: Amy Curtis, female   DOB: 1941-01-16, 82 y.o.   MRN: [id number]   Subjective: 3 Days Post-Op Procedure(s) (LRB): RIGHT TOTAL KNEE ARTHROPLASTY (Right) Patient reports pain as mild and moderate.    Objective: Vital signs in last 24 hours: Temp:  [98.4 F (36.9 C)-99.3 F (37.4 C)] 99.3 F (37.4 C) (04/22 0447) Pulse Rate:  [78-84] 78 (04/22 0825) Resp:  [16-18] 16 (04/22 0825) BP: (120-148)/(52-59) 125/59 (04/22 0825) SpO2:  [94 %-96 %] 94 % (04/22 0825)  Intake/Output from previous day: 04/21 0701 - 04/22 0700 In: 240 [P.O.:240] Out: -  Intake/Output this shift: No intake/output data recorded.  Recent Labs    05/08/22 0201 05/09/22 0245  HGB 10.4* 9.4*   Recent Labs    05/08/22 0201 05/09/22 0245  WBC 13.2* 9.0  RBC 3.42* 3.04*  HCT 30.8* 27.2*  PLT 187 164   Recent Labs    05/08/22 0201  NA 133*  K 3.5  CL 95*  CO2 26  BUN 24*  CREATININE 0.75  GLUCOSE 211*  CALCIUM 9.8   No results for input(s): "LABPT", "INR" in the last 72 hours.  Neurologically intact No results found.  Assessment/Plan: 3 Days Post-Op Procedure(s) (LRB): RIGHT TOTAL KNEE ARTHROPLASTY (Right) Up with therapy. Likely home this afternoon. HHPT . Office one week on a Wed or Thursday in [place] [facility].  Acute blood loss anemia drop from 13.1 to 9.4 Hgb.   Does not need transfusion. Discussed with pt , her Hgb will rebound over next 2 wks.   Eldred Manges 05/10/2022, 9:25 AM

## 2022-05-10 NOTE — Progress Notes (Signed)
Discharge instructions given. Patient verbalized understanding and all questions were answered.  ?

## 2022-05-10 NOTE — Progress Notes (Signed)
Patient ID: Fleet Contras, female   DOB: Mar 05, 1940, 82 y.o.   MRN: [id number] Continues for progress physical therapy.  Patient's son available to help with step to get her into the house.  Office follow-up with me middle of next week in the clinic in [facility].  AVS ready for printing.

## 2022-05-10 NOTE — Discharge Summary (Addendum)
Physician Discharge Summary  Patient ID: Amy Curtis MRN: [id number] DOB/AGE: 84/08/1940 84 y.o.  Admit date: 05/07/2022 Discharge date: 05/10/2022  Admission Diagnoses: Right knee primary osteoarthritis  Discharge Diagnoses: Same. Principal Problem:   S/P TKR (total knee replacement), right Active Problems:   S/P TKR (total knee replacement)   Acute blood loss anemia   Discharged Condition: stable  Hospital Course: Patient failed conservative treatment for right knee primary osteoarthritis.  Previous total knee arthroplasty 7 years ago 2017 doing well.  She is also had lumbar decompression surgery in 2015 and 2018.  Patient underwent right total knee arthroplasty on admission date 05/07/2022 under spinal anesthesia.  Postop she made slow progress with physical therapy with some left episodes of knee buckling on the right knee with quad weakness.  Preop hemoglobin was 13.5 decreased to 9.4 at the time of discharge without hypotension.  Home physical therapy was arranged.  Consults:  Physical therapy and Occupational Therapy.  Care management saw her for arrangement for home health physical therapy.  Significant Diagnostic Studies: labs: Labs as mentioned above hemoglobin on admission 13.5.  Decreased to 9.4 and stable at the time of discharge.  Treatments: Right cemented total knee arthroplasty.  Discharge Exam: Blood pressure (!) 127/55, pulse 78, temperature 98.4 F (36.9 C), temperature source Oral, resp. rate 18, height 5' 3.5" (1.613 m), weight 97.5 kg, SpO2 94 %. Physical exam.  Mild knee swelling dressing was dry new dressing was applied before discharge.  She had a knee immobilizer that she can wear with ambulation to prevent knee buckling.  She will remove this when she does therapy.  Disposition: Discharge disposition: 01-Home or Self Care      Home PD 3 times a week x 2 weeks.  She will then be transferred to outpatient therapy.  Office appointment with me on  discharge in the [facility].  Allergies as of 05/10/2022       Reactions   Adhesive [tape] Itching, Rash   HOSPITAL TAPE, Rash did not heal well        Medication List     STOP taking these medications    ibuprofen 200 MG tablet Commonly known as: ADVIL       TAKE these medications    allopurinol 100 MG tablet Commonly known as: ZYLOPRIM Take 200 mg by mouth daily.   aspirin 325 MG tablet Commonly known as: Bayer Aspirin Take 1 tablet (325 mg total) by mouth daily. Take one daily for 4 weeks to decrease blood clot risks   chlorthalidone 25 MG tablet Commonly known as: HYGROTON Take 25 mg by mouth daily with breakfast.   dorzolamide-timolol 2-0.5 % ophthalmic solution Commonly known as: COSOPT Place 1 drop into both eyes 2 (two) times daily.   ketoconazole 2 % cream Commonly known as: NIZORAL Apply 1 Application topically 2 (two) times daily as needed for irritation.   latanoprost 0.005 % ophthalmic solution Commonly known as: XALATAN Place 1 drop into both eyes at bedtime.   lisinopril 20 MG tablet Commonly known as: ZESTRIL Take 20 mg by mouth daily.   Magnesium 100 MG Tabs Take 100 mg by mouth daily.   methocarbamol 500 MG tablet Commonly known as: ROBAXIN Take 1 tablet (500 mg total) by mouth every 8 (eight) hours as needed for muscle spasms.   multivitamin with minerals Tabs tablet Take 1 tablet by mouth daily.   nabumetone 500 MG tablet Commonly known as: RELAFEN Take 500 mg by mouth 2 (two) times daily as  needed for moderate pain.   omeprazole 40 MG capsule Commonly known as: PRILOSEC Take 40 mg by mouth daily.   oxyCODONE-acetaminophen 5-325 MG tablet Commonly known as: Percocet Take 1 tablet by mouth every 4 (four) hours as needed for severe pain.   PARoxetine 20 MG tablet Commonly known as: PAXIL Take 20 mg by mouth daily.   primidone 50 MG tablet Commonly known as: MYSOLINE Take 50 mg by mouth at bedtime.   verapamil 180 MG  CR tablet Commonly known as: CALAN-SR Take 180 mg by mouth 2 (two) times daily.   Vitamin D3 125 MCG (5000 UT) Caps Take 5,000 Units by mouth daily.        Follow-up Information     [facility] Follow up.   Why: Call to schedule first home visit. Contact information: [address] [place] [address] [phone]                 Signed: Eldred Manges 05/10/2022, 3:59 PM

## 2022-05-10 NOTE — Progress Notes (Signed)
Physical Therapy Treatment Patient Details Name: Amy Curtis MRN: [id number] DOB: 08/17/1940 Today's Date: 05/10/2022   History of Present Illness 82 y.o. female presents to [facility] hospital on 05/07/2022 for elective R TKA. PMH includes anxiety, cataract, depression, GERD, glaucoma, HTN, hypothyroidism, DMII.    PT Comments    Continuing work on functional mobility and activity tolerance;  Last session today to practice another technique for getting up the step to enter her home (as outlined below); Son Amy Curtis present and helpful; Questions solicited and answered; Contacted Dr. Ophelia Curtis to see about getting an order for a Knee immobilizer for R knee stability   Recommendations for follow up therapy are one component of a multi-disciplinary discharge planning process, led by the attending physician.  Recommendations may be updated based on patient status, additional functional criteria and insurance authorization.  Follow Up Recommendations       Assistance Recommended at Discharge Intermittent Supervision/Assistance  Patient can return home with the following A little help with walking and/or transfers;A little help with bathing/dressing/bathroom;Assistance with cooking/housework;Assist for transportation;Help with stairs or ramp for entrance   Equipment Recommendations  None recommended by PT (reports has a BSC)    Recommendations for Other Services       Precautions / Restrictions Precautions Precautions: Fall;Knee Precaution Booklet Issued: Yes (comment) Required Braces or Orthoses: Knee Immobilizer - Right (especially for complex transfers) Restrictions RLE Weight Bearing: Weight bearing as tolerated     Mobility  Bed Mobility                    Transfers Overall transfer level: Needs assistance Equipment used: Rolling walker (2 wheels) Transfers: Sit to/from Stand Sit to Stand: Supervision   Step pivot transfers: Supervision       General transfer comment:  stood well form recliner    Ambulation/Gait Ambulation/Gait assistance: Min guard Gait Distance (Feet):  (pivot steps recliner to stair setup for practice, and back to recliner) Assistive device: Rolling walker (2 wheels) Gait Pattern/deviations: Step-to pattern       General Gait Details: slow step to gait, heavy reliance on UEs; cues to activate R quad for stance stabiltiy   Stairs Stairs: Yes Stairs assistance: Min assist Stair Management: No rails, Backwards ("sitting up" the step with a chair placed at the edge of the step) Number of Stairs: 1 General stair comments: Used the backwards "sitting up" the steps technique; son Amy Curtis present and helpful   Wheelchair Mobility    Modified Rankin (Stroke Patients Only)       Balance     Sitting balance-[name]: Good       Standing balance-[name]: Poor                              Cognition Arousal/Alertness: Awake/alert Behavior During Therapy: WFL for tasks assessed/performed Overall Cognitive Status: Within Functional Limits for tasks assessed                                          Exercises Other Exercises Other Exercises: reviewed no pillow under knee, as pt with yellow foam under knee on arrival    General Comments General comments (skin integrity, edema, etc.): Practiced simulated car transfer by turning from side of chair to front of chair      Pertinent Vitals/Pain Pain Assessment Pain Assessment: Faces  Faces Pain Scale: Hurts even more Pain Location: R knee Pain Descriptors / Indicators: Grimacing, Sharp, Sore Pain Intervention(s): Monitored during session    Home Living                          Prior Function            PT Goals (current goals can now be found in the care plan section) Acute Rehab PT Goals Patient Stated Goal: to go home PT Goal Formulation: With patient Time For Goal Achievement: 05/11/22 Potential to Achieve Goals:  Good Progress towards PT goals: Progressing toward goals    Frequency    7X/week      PT Plan Current plan remains appropriate    Co-evaluation              AM-PAC PT "6 Clicks" Mobility   Outcome Measure  Help needed turning from your back to your side while in a flat bed without using bedrails?: A Little Help needed moving from lying on your back to sitting on the side of a flat bed without using bedrails?: A Little Help needed moving to and from a bed to a chair (including a wheelchair)?: A Little Help needed standing up from a chair using your arms (e.g., wheelchair or bedside chair)?: A Little Help needed to walk in hospital room?: A Little Help needed climbing 3-5 steps with a railing? : Total 6 Click Score: 16    End of Session Equipment Utilized During Treatment: Gait belt Activity Tolerance: Patient tolerated treatment well Patient left: in chair;with call bell/phone within reach;with family/visitor present Nurse Communication: Mobility status PT Visit Diagnosis: Other abnormalities of gait and mobility (R26.89);Muscle weakness (generalized) (M62.81);Pain Pain - Right/Left: Right Pain - part of body: Knee     Time: 8657-8469 PT Time Calculation (min) (ACUTE ONLY): 25 min  Charges:  $Gait Training: 23-37 mins $Therapeutic Activity: 23-37 mins                     Amy Curtis, PT  Acute Rehabilitation Services Office [phone]    Amy Curtis 05/10/2022, 6:36 PM

## 2022-05-10 NOTE — Plan of Care (Signed)

## 2022-05-10 NOTE — Progress Notes (Signed)
Occupational Therapy Treatment Patient Details Name: Amy Curtis MRN: [id number] DOB: Oct 16, 1940 Today's Date: 05/10/2022   History of present illness 82 y.o. female presents to [facility] hospital on 05/07/2022 for elective R TKA. PMH includes anxiety, cataract, depression, GERD, glaucoma, HTN, hypothyroidism, DMII.   OT comments  Patient with fair progress toward patient focused goals.  Patient continues to struggle with basic in room mobility/toileting.  She is needing less physical assist, closer to supervision, but Min Guard when turning with RW.  Patient continues with Mod A with ADL completion due to R knee pain.  OT continues to be indicated in the acute setting to address deficits, and home based PT is needed.     Recommendations for follow up therapy are one component of a multi-disciplinary discharge planning process, led by the attending physician.  Recommendations may be updated based on patient status, additional functional criteria and insurance authorization.    Assistance Recommended at Discharge Intermittent Supervision/Assistance  Patient can return home with the following  Assist for transportation;Assistance with cooking/housework;A little help with bathing/dressing/bathroom   Equipment Recommendations  None recommended by OT    Recommendations for Other Services      Precautions / Restrictions Precautions Precautions: Fall;Knee Precaution Booklet Issued: Yes (comment) Restrictions Weight Bearing Restrictions: Yes RLE Weight Bearing: Weight bearing as tolerated       Mobility Bed Mobility                    Transfers   Equipment used: Rolling walker (2 wheels) Transfers: Sit to/from Stand Sit to Stand: Supervision     Step pivot transfers: Supervision, Min guard           Balance   Sitting-balance support: No upper extremity supported, Feet supported Sitting balance-[name]: Good     Standing balance support: Reliant on assistive device  for balance Standing balance-[name]: Poor                             ADL either performed or assessed with clinical judgement   ADL                       Lower Body Dressing: Moderate assistance;Sit to/from stand   Toilet Transfer: Supervision/safety;Rolling walker (2 wheels);Ambulation;Regular Toilet                  Extremity/Trunk Assessment Upper Extremity Assessment Upper Extremity Assessment: Overall WFL for tasks assessed;RUE deficits/detail RUE Deficits / Details: Chronic R shoulder dysfunction       Cervical / Trunk Assessment Cervical / Trunk Assessment: Kyphotic    Vision Patient Visual Report: No change from baseline     Perception     Praxis      Cognition Arousal/Alertness: Awake/alert Behavior During Therapy: WFL for tasks assessed/performed Overall Cognitive Status: Within Functional Limits for tasks assessed                                                             Pertinent Vitals/ Pain       Pain Assessment Pain Assessment: Faces Faces Pain Scale: Hurts even more Pain Location: R knee Pain Descriptors / Indicators: Grimacing, Sharp, Sore  Frequency  Min 2X/week        Progress Toward Goals  OT Goals(current goals can now be found in the care plan section)  Progress towards OT goals: Progressing toward goals  Acute Rehab OT Goals OT Goal Formulation: With patient Time For Goal Achievement: 05/21/22 Potential to Achieve Goals: Good  Plan Discharge plan remains appropriate    Co-evaluation                 AM-PAC OT "6 Clicks" Daily Activity     Outcome Measure   Help from another person eating meals?: None Help from another person taking care of personal grooming?: A Little Help from another person toileting, which includes using toliet, bedpan, or urinal?: A Little Help from  another person bathing (including washing, rinsing, drying)?: A Lot Help from another person to put on and taking off regular upper body clothing?: None Help from another person to put on and taking off regular lower body clothing?: A Lot 6 Click Score: 18    End of Session Equipment Utilized During Treatment: Rolling walker (2 wheels)  OT Visit Diagnosis: Unsteadiness on feet (R26.81);Pain Pain - Right/Left: Right Pain - part of body: Knee   Activity Tolerance Patient tolerated treatment well   Patient Left in bed;with call bell/phone within reach   Nurse Communication Mobility status        Time: 1610-9604 OT Time Calculation (min): 22 min  Charges: OT General Charges $OT Visit: 1 Visit OT Treatments $Self Care/Home Management : 8-22 mins  05/10/2022  RP, OTR/L  Acute Rehabilitation Services  Office:  [phone]   Suzanna Obey 05/10/2022, 9:26 AM

## 2022-05-10 NOTE — Progress Notes (Signed)
Physical Therapy Treatment Patient Details Name: Amy Curtis MRN: [id number] DOB: 1940-05-13 Today's Date: 05/10/2022   History of Present Illness 82 y.o. female presents to [facility] hospital on 05/07/2022 for elective R TKA. PMH includes anxiety, cataract, depression, GERD, glaucoma, HTN, hypothyroidism, DMII.    PT Comments    Continuing work on functional mobility and activity tolerance;  session focused on safety getting home, and pt's son present to practice; Another episode of R knee buckle with making that step up and needed 2 person Max assist to prevent fall; Described the other technqiues for getting up the step with ehr son [name]; Pt anxious after the near fall, and opted to let her rest a bit before returning to practice step technqiue with chair  Recommendations for follow up therapy are one component of a multi-disciplinary discharge planning process, led by the attending physician.  Recommendations may be updated based on patient status, additional functional criteria and insurance authorization.  Follow Up Recommendations       Assistance Recommended at Discharge Intermittent Supervision/Assistance  Patient can return home with the following A little help with walking and/or transfers;A little help with bathing/dressing/bathroom;Assistance with cooking/housework;Assist for transportation;Help with stairs or ramp for entrance   Equipment Recommendations  None recommended by PT (reports has a BSC)    Recommendations for Other Services       Precautions / Restrictions Precautions Precautions: Fall;Knee Precaution Booklet Issued: Yes (comment) Required Braces or Orthoses: Knee Immobilizer - Right (especially for complex transfers) Restrictions RLE Weight Bearing: Weight bearing as tolerated     Mobility  Bed Mobility                    Transfers Overall transfer level: Needs assistance Equipment used: Rolling walker (2 wheels) Transfers: Sit to/from Stand Sit  to Stand: Supervision, Mod assist, +2 physical assistance   Step pivot transfers: Supervision       General transfer comment: Good rise form recliner using armrests; later in session, had to sit to a chair without armrests, and had difficulty getting up for that chair, needed 2 person mod assist to get back on her feet    Ambulation/Gait Ambulation/Gait assistance: Min guard (with physical contact) Gait Distance (Feet): 45 Feet Assistive device: Rolling walker (2 wheels) Gait Pattern/deviations: Step-to pattern       General Gait Details: slow step to gait, heavy reliance on UEs; cues to activate R quad for stance stabiltiy   Stairs Stairs: Yes Stairs assistance: +2 physical assistance, Max assist Stair Management: No rails, Forwards, With walker Number of Stairs: 1 General stair comments: practiced getting up the one step with pt's son; R knee buckled again, and pt needed 2 person max assist to prevent fall   Wheelchair Mobility    Modified Rankin (Stroke Patients Only)       Balance     Sitting balance-[name]: Good       Standing balance-[name]: Poor                              Cognition Arousal/Alertness: Awake/alert Behavior During Therapy: WFL for tasks assessed/performed Overall Cognitive Status: Within Functional Limits for tasks assessed                                          Exercises Other Exercises Other Exercises:  reviewed no pillow under knee, as pt with yellow foam under knee on arrival    General Comments General comments (skin integrity, edema, etc.): Lots of difficulty with funcitonal mobility after teh episode of R knee buckling, and needing to sit; difficulty with scooting in chair to edge to stand up      Pertinent Vitals/Pain Pain Assessment Pain Assessment: Faces Faces Pain Scale: Hurts even more Pain Location: R knee Pain Descriptors / Indicators: Grimacing, Sharp, Sore Pain  Intervention(s): Limited activity within patient's tolerance    Home Living                          Prior Function            PT Goals (current goals can now be found in the care plan section) Acute Rehab PT Goals Patient Stated Goal: to go home PT Goal Formulation: With patient Time For Goal Achievement: 05/11/22 Potential to Achieve Goals: Good Progress towards PT goals: Progressing toward goals    Frequency    7X/week      PT Plan Current plan remains appropriate    Co-evaluation              AM-PAC PT "6 Clicks" Mobility   Outcome Measure  Help needed turning from your back to your side while in a flat bed without using bedrails?: A Little Help needed moving from lying on your back to sitting on the side of a flat bed without using bedrails?: A Little Help needed moving to and from a bed to a chair (including a wheelchair)?: A Little Help needed standing up from a chair using your arms (e.g., wheelchair or bedside chair)?: A Little Help needed to walk in hospital room?: A Little Help needed climbing 3-5 steps with a railing? : Total 6 Click Score: 16    End of Session Equipment Utilized During Treatment: Gait belt Activity Tolerance: Patient tolerated treatment well Patient left: in chair;with call bell/phone within reach;with family/visitor present Nurse Communication: Mobility status PT Visit Diagnosis: Other abnormalities of gait and mobility (R26.89);Muscle weakness (generalized) (M62.81);Pain Pain - Right/Left: Right Pain - part of body: Knee     Time: 1300-1347 PT Time Calculation (min) (ACUTE ONLY): 47 min  Charges:  $Gait Training: 23-37 mins $Therapeutic Activity: 8-22 mins                     Van Clines, PT  Acute Rehabilitation Services Office [phone]    [name] 05/10/2022, 3:51 PM

## 2022-05-11 ENCOUNTER — Telehealth: Payer: Self-pay | Admitting: Orthopaedic Surgery

## 2022-05-11 NOTE — Telephone Encounter (Signed)
Patient states she was just discharged from hospital yesterday and want to know if she need to keep appointment for Thursday or schedule it for another day. [facility]

## 2022-05-11 NOTE — Telephone Encounter (Signed)
I called Arna Medici and advised, Dr. Ophelia Charter is ordering physician for HHPT.

## 2022-05-11 NOTE — Telephone Encounter (Signed)
I spoke with patient. Appt for Thursday canceled and rescheduled for 05/20/2022 at 0930.

## 2022-05-12 NOTE — Anesthesia Postprocedure Evaluation (Signed)
Anesthesia Post Note  Patient: Fleet Contras  Procedure(s) Performed: RIGHT TOTAL KNEE ARTHROPLASTY (Right: Knee)     Patient location during evaluation: PACU Anesthesia Type: Regional and General Level of consciousness: awake and alert Pain management: pain level controlled Vital Signs Assessment: post-procedure vital signs reviewed and stable Respiratory status: spontaneous breathing, nonlabored ventilation, respiratory function stable and patient connected to nasal cannula oxygen Cardiovascular status: blood pressure returned to baseline and stable Postop Assessment: no apparent nausea or vomiting Anesthetic complications: no  No notable events documented.  Last Vitals:  Vitals:   05/10/22 0825 05/10/22 1508  BP: (!) 125/59 (!) 127/55  Pulse: 78 78  Resp: 16 18  Temp:  36.9 C  SpO2: 94% 94%    Last Pain:  Vitals:   05/10/22 1508  TempSrc: Oral  PainSc:                  Camrynn Mcclintic L Riggins Cisek

## 2022-05-13 ENCOUNTER — Encounter: Payer: Medicare PPO | Admitting: Orthopaedic Surgery

## 2022-05-17 MED ORDER — DEXAMETHASONE SODIUM PHOSPHATE 10 MG/ML IJ SOLN
INTRAMUSCULAR | Status: DC | PRN
  Administered 2022-05-07: 10 mg

## 2022-05-17 MED ORDER — ROPIVACAINE HCL 5 MG/ML IJ SOLN
INTRAMUSCULAR | Status: DC | PRN
  Administered 2022-05-07: 20 mL via PERINEURAL

## 2022-05-17 NOTE — Addendum Note (Signed)
Addendum  created 05/17/22 0843 by Elmer Picker, MD   Child order released for a procedure order, Clinical Note Signed, Intraprocedure Blocks edited, Intraprocedure Meds edited, SmartForm saved

## 2022-05-17 NOTE — Anesthesia Procedure Notes (Signed)
Anesthesia Regional Block: Adductor canal block   Pre-Anesthetic Checklist: , timeout performed,  Correct Patient, Correct Site, Correct Laterality,  Correct Procedure, Correct Position, site marked,  Risks and benefits discussed,  Pre-op evaluation,  At surgeon's request and post-op pain management  Laterality: Right  Prep: Maximum Sterile Barrier Precautions used, chloraprep       Needles:  Injection technique: Single-shot  Needle Type: Echogenic Stimulator Needle     Needle Length: 9cm  Needle Gauge: 21     Additional Needles:   Procedures:,,,, ultrasound used (permanent image in chart),,    Narrative:  Start time: 05/07/2022 7:05 AM End time: 05/07/2022 7:10 AM Injection made incrementally with aspirations every 5 mL. Anesthesiologist: Elmer Picker, MD

## 2022-05-20 ENCOUNTER — Encounter: Payer: Self-pay | Admitting: Orthopaedic Surgery

## 2022-05-20 ENCOUNTER — Ambulatory Visit (INDEPENDENT_AMBULATORY_CARE_PROVIDER_SITE_OTHER): Payer: Medicare PPO | Admitting: Orthopaedic Surgery

## 2022-05-20 ENCOUNTER — Other Ambulatory Visit (INDEPENDENT_AMBULATORY_CARE_PROVIDER_SITE_OTHER): Payer: Medicare PPO

## 2022-05-20 VITALS — Ht 63.5 in | Wt 215.0 lb

## 2022-05-20 DIAGNOSIS — Z96651 Presence of right artificial knee joint: Secondary | ICD-10-CM

## 2022-05-20 MED ORDER — OXYCODONE-ACETAMINOPHEN 5-325 MG PO TABS: 1.0000 | ORAL_TABLET | ORAL | 0 refills | Status: AC | PRN

## 2022-05-20 NOTE — Progress Notes (Signed)
Post-Op Visit Note   Patient: Amy Curtis           Date of Birth: [birth year]           MRN: [id number] Visit Date: 05/20/2022 PCP: Berneda Rose, FNP   Assessment & Plan: Follow-up right total knee arthroplasty.  She has been using a lot of ice has some area of erythema she does have lower extremity edema she is by herself sitting at home currently her sons are back at work.  Staples are harvested today Home therapy is coming out to work with her 3 times a week.  Recheck 3 weeks.  She is flexing to 80 degrees has a 30 degree extension lag.  Chief Complaint:  Chief Complaint  Patient presents with   Right Knee - Routine Post Op    05/07/2022 Right TKA   Visit Diagnoses:  1. S/P total knee arthroplasty, right     Plan: rov 3 wks  Follow-Up Instructions: Return in about 3 weeks (around 06/10/2022).   Orders:  Orders Placed This Encounter  Procedures   XR Knee 1-2 Views Right   Meds ordered this encounter  Medications   oxyCODONE-acetaminophen (PERCOCET) 5-325 MG tablet    Sig: Take 1 tablet by mouth every 4 (four) hours as needed for severe pain.    Dispense:  45 tablet    Refill:  0    Post op total knee , right    Imaging: No results found.  PMFS History: Patient Active Problem List   Diagnosis Date Noted   Acute blood loss anemia 05/10/2022   S/P TKR (total knee replacement) 05/08/2022   S/P TKR (total knee replacement), right 05/07/2022   Unilateral primary osteoarthritis, right knee 04/24/2020   Quadriceps weakness 08/31/2018   Herniated nucleus pulposus, lumbar 07/05/2016   Lumbar herniated disc 07/05/2016   Status post total left knee replacement 03/26/2015   Spinal stenosis, lumbar region, with neurogenic claudication 11/05/2013   Lumbar spinal stenosis 11/05/2013   Past Medical History:  Diagnosis Date   Anxiety    Arthritis    "back, legs, knees" (03/27/2015)   Cataract    left eye   Depression    GERD (gastroesophageal reflux disease)     Glaucoma    R eye only but uses gtts in both eyes   H/O hiatal hernia    HNP (herniated nucleus pulposus)    right lumbar 4-5   HOH (hard of hearing)    Hypertension    Hypothyroidism    Migraines    "stopped when my periods stopped"   PONV (postoperative nausea and vomiting)    Shortness of breath    Sleep apnea    "tried CPAP; couldn't wear it"    Type II diabetes mellitus (HCC)    losing weight & careful dietary choices, no Rx (03/27/2015)   Wears dentures    Wears glasses     Family History  Problem Relation Age of Onset   Lung disease Mother    Lung disease Father     Past Surgical History:  Procedure Laterality Date   BACK SURGERY     CARPAL TUNNEL RELEASE Right    CATARACT EXTRACTION     COLONOSCOPY     DILATION AND CURETTAGE OF UTERUS  X 2   JOINT REPLACEMENT     LUMBAR DISC SURGERY  2011   done in [place]   LUMBAR LAMINECTOMY N/A 11/05/2013   Procedure: L3-4 Decompression;  Surgeon: Eldred Manges, MD;  [facility]: [facility];  Service: Orthopedics;  Laterality: N/A;   LUMBAR LAMINECTOMY Right 07/05/2016   Procedure: RIGHT L-4 HEMILAMINECTOMY, LAMINOTOMY L-5 S-1 ;  Surgeon: Eldred Manges, MD;  Location: [facility];  Service: Orthopedics;  Laterality: Right;   MULTIPLE TOOTH EXTRACTIONS     SHOULDER ARTHROSCOPY W/ ROTATOR CUFF REPAIR Right ?2013   TONSILLECTOMY     TOTAL KNEE ARTHROPLASTY Left 03/26/2015   Procedure: TOTAL KNEE ARTHROPLASTY;  Surgeon: Eldred Manges, MD;  Location: [facility];  Service: Orthopedics;  Laterality: Left;   TOTAL KNEE ARTHROPLASTY Right 05/07/2022   Procedure: RIGHT TOTAL KNEE ARTHROPLASTY;  Surgeon: Eldred Manges, MD;  [facility]: [facility];  Service: Orthopedics;  Laterality: Right;  Needs RNFA   TUBAL LIGATION     Social History   Occupational History   Not on file  Tobacco Use   Smoking status: Never   Smokeless tobacco: Never  Vaping Use   Vaping Use: Never used  Substance and Sexual Activity   Alcohol use: No   Drug use: No   Sexual activity:  Never

## 2022-06-07 ENCOUNTER — Telehealth: Payer: Self-pay | Admitting: Radiology

## 2022-06-07 ENCOUNTER — Telehealth: Payer: Self-pay | Admitting: Orthopaedic Surgery

## 2022-06-07 NOTE — Telephone Encounter (Signed)
Patient called Amy Curtis office and states that she has fallen a couple of times and feels like the knee that she just had surgery on is giving way on her. She is now staying alone and is concerned about falling.  CB  [phone].  Patient has follow up scheduled for Thursday, 06/10/2022.

## 2022-06-07 NOTE — Telephone Encounter (Signed)
[facility]--Mrs. Salli Real had several falls this weekend and P/T advising they don't want to Do anything till Dr. Ophelia Charter chesck patient later this week. Please call--( Nadine Counts) [phone]

## 2022-06-08 NOTE — Telephone Encounter (Signed)
I spoke with patient and advised. She states that she was doing well, but knee gave out on Friday and she fell to the floor. Pain is mostly in lower leg and ankle. Knee feels good. I advised we would check leg when she comes in for appointment on Thursday for recheck.

## 2022-06-10 ENCOUNTER — Ambulatory Visit (INDEPENDENT_AMBULATORY_CARE_PROVIDER_SITE_OTHER): Payer: Medicare PPO | Admitting: Orthopaedic Surgery

## 2022-06-10 ENCOUNTER — Other Ambulatory Visit (INDEPENDENT_AMBULATORY_CARE_PROVIDER_SITE_OTHER): Payer: Medicare PPO

## 2022-06-10 ENCOUNTER — Encounter: Payer: Self-pay | Admitting: Orthopaedic Surgery

## 2022-06-10 ENCOUNTER — Other Ambulatory Visit: Payer: Self-pay | Admitting: Orthopaedic Surgery

## 2022-06-10 VITALS — Ht 63.5 in | Wt 215.0 lb

## 2022-06-10 DIAGNOSIS — M25561 Pain in right knee: Secondary | ICD-10-CM | POA: Diagnosis not present

## 2022-06-10 DIAGNOSIS — M25571 Pain in right ankle and joints of right foot: Secondary | ICD-10-CM

## 2022-06-10 DIAGNOSIS — S82839A Other fracture of upper and lower end of unspecified fibula, initial encounter for closed fracture: Secondary | ICD-10-CM | POA: Insufficient documentation

## 2022-06-10 DIAGNOSIS — S82831A Other fracture of upper and lower end of right fibula, initial encounter for closed fracture: Secondary | ICD-10-CM

## 2022-06-10 DIAGNOSIS — Z96651 Presence of right artificial knee joint: Secondary | ICD-10-CM

## 2022-06-10 DIAGNOSIS — S86811A Strain of other muscle(s) and tendon(s) at lower leg level, right leg, initial encounter: Secondary | ICD-10-CM

## 2022-06-10 MED ORDER — TRAMADOL HCL 50 MG PO TABS: 50.0000 mg | ORAL_TABLET | Freq: Two times a day (BID) | ORAL | 1 refills | Status: AC | PRN

## 2022-06-10 NOTE — Progress Notes (Signed)
Post-Op Visit Note   Patient: Amy Curtis           Date of Birth: July 05, 1940           MRN: [id number] Visit Date: 06/10/2022 PCP: Berneda Rose, FNP   Assessment & Plan: Follow-up total knee arthroplasty right on 05/07/2022.  She already had left knee done 2017 doing well.  Patient's had 2 falls.  She now has 60 degree extension lag and new x-rays today comparison of postop show she has patella alto.  She also has a proximal fibular fracture.  Patient states she is fearful of falling and somehow she has been advanced to a rolling walker with a seat rather than the folding walker with 5 inch front wheels.  Reviewed x-rays with patient today and shows that the patella was 6+ centimeters proximally migrated and she is ruptured her patellar tendon with the fall as well as had a proximal fibular fracture.  Knee immobilizer applied MRI scan ordered.  We discussed with her significant rerupture rates with patellar tendon reconstruction and repair surgeries.  Will see her back after her scan and discuss this further.  We also discussed the double upright short leg brace with drop lock knee.  Chief Complaint:  Chief Complaint  Patient presents with   Right Knee - Pain, Follow-up    05/07/2022 Right TKA   Right Ankle - Pain   Visit Diagnoses:  1. Pain in right ankle and joints of right foot   2. Acute pain of right knee   3. S/P total knee arthroplasty, right   4. Other closed fracture of proximal end of right fibula, initial encounter   5. Patellar tendon rupture, right, initial encounter     Plan: Patient with fall and postop fibular fracture as well as ruptured patellar tendon.  She needs to keep the knee immobilizer on snugly centered over her knee.  MRI scan ordered to evaluate patellar tendon rupture either mid substance, proximal versus distal avulsion.  Discussed briefly with her surgery and problems with repeat rupture that can occur.  Follow-up after MRI scan.  Follow-Up  Instructions: No follow-ups on file.   Orders:  Orders Placed This Encounter  Procedures   XR Ankle Complete Right   XR Knee 1-2 Views Right   No orders of the defined types were placed in this encounter.   Imaging: XR Knee 1-2 Views Right  Result Date: 06/10/2022 Standing AP and lateral right knee x-rays obtained post fall that occurred twice since her surgery 3 weeks ago.  Patient has proximal fibular fracture also patella also with proximal position of the patella 6 cm above her right postop position. Impression: Post total knee arthroplasty with proximal fibular fracture and radiographs consistent with patellar tendon rupture.  XR Ankle Complete Right  Result Date: 06/10/2022 Right ankle x-rays are obtained and are normal.  No evidence of ankle fracture. Impression: Negative right ankle x-rays.   PMFS History: Patient Active Problem List   Diagnosis Date Noted   Closed fracture of proximal fibula 06/10/2022   Patellar tendon rupture, right, initial encounter 06/10/2022   Acute blood loss anemia 05/10/2022   S/P TKR (total knee replacement) 05/08/2022   S/P TKR (total knee replacement), right 05/07/2022   Unilateral primary osteoarthritis, right knee 04/24/2020   Quadriceps weakness 08/31/2018   Herniated nucleus pulposus, lumbar 07/05/2016   Lumbar herniated disc 07/05/2016   Status post total left knee replacement 03/26/2015   Spinal stenosis, lumbar region, with neurogenic claudication 11/05/2013  Lumbar spinal stenosis 11/05/2013   Past Medical History:  Diagnosis Date   Anxiety    Arthritis    "back, legs, knees" (03/27/2015)   Cataract    left eye   Depression    GERD (gastroesophageal reflux disease)    Glaucoma    R eye only but uses gtts in both eyes   H/O hiatal hernia    HNP (herniated nucleus pulposus)    right lumbar 4-5   HOH (hard of hearing)    Hypertension    Hypothyroidism    Migraines    "stopped when my periods stopped"   PONV  (postoperative nausea and vomiting)    Shortness of breath    Sleep apnea    "tried CPAP; couldn't wear it"    Type II diabetes mellitus (HCC)    losing weight & careful dietary choices, no Rx (03/27/2015)   Wears dentures    Wears glasses     Family History  Problem Relation Age of Onset   Lung disease Mother    Lung disease Father     Past Surgical History:  Procedure Laterality Date   BACK SURGERY     CARPAL TUNNEL RELEASE Right    CATARACT EXTRACTION     COLONOSCOPY     DILATION AND CURETTAGE OF UTERUS  X 2   JOINT REPLACEMENT     LUMBAR DISC SURGERY  2011   done in [place]   LUMBAR LAMINECTOMY N/A 11/05/2013   Procedure: L3-4 Decompression;  Surgeon: Eldred Manges, MD;  [facility];  Service: Orthopedics;  Laterality: N/A;   LUMBAR LAMINECTOMY Right 07/05/2016   Procedure: RIGHT L-4 HEMILAMINECTOMY, LAMINOTOMY L-5 S-1 ;  Surgeon: Eldred Manges, MD;  [facility];  Service: Orthopedics;  Laterality: Right;   MULTIPLE TOOTH EXTRACTIONS     SHOULDER ARTHROSCOPY W/ ROTATOR CUFF REPAIR Right ?2013   TONSILLECTOMY     TOTAL KNEE ARTHROPLASTY Left 03/26/2015   Procedure: TOTAL KNEE ARTHROPLASTY;  Surgeon: Eldred Manges, MD;  [facility];  Service: Orthopedics;  Laterality: Left;   TOTAL KNEE ARTHROPLASTY Right 05/07/2022   Procedure: RIGHT TOTAL KNEE ARTHROPLASTY;  Surgeon: Eldred Manges, MD;  [facility];  Service: Orthopedics;  Laterality: Right;  Needs RNFA   TUBAL LIGATION     Social History   Occupational History   Not on file  Tobacco Use   Smoking status: Never   Smokeless tobacco: Never  Vaping Use   Vaping Use: Never used  Substance and Sexual Activity   Alcohol use: No   Drug use: No   Sexual activity: Never

## 2022-06-10 NOTE — Addendum Note (Signed)
Addended by: Rogers Seeds on: 06/10/2022 10:51 AM   Modules accepted: Orders

## 2022-06-17 ENCOUNTER — Telehealth: Payer: Self-pay | Admitting: Radiology

## 2022-06-17 ENCOUNTER — Telehealth: Payer: Self-pay | Admitting: Orthopaedic Surgery

## 2022-06-17 NOTE — Telephone Encounter (Signed)
Patient would like to know results from MRI that was ordered for her.  CB [phone]  Please advise. FYI-Today [birth year] patient's birthday!

## 2022-06-17 NOTE — Telephone Encounter (Signed)
[facility], patient has had several falls and they are adviseing that they need orders to discharge care until patient has her fractures treated. Please call [phone]([facility])

## 2022-06-18 NOTE — Telephone Encounter (Signed)
Offered appt 06/24/2202 but patient will not have ride. Dr. Ophelia Charter is not in the office the following week. Patient is scheduled to come in to office on 06/23/2022 at 10am to get rx for brace.

## 2022-06-18 NOTE — Telephone Encounter (Signed)
I called and advised. They are going to discharge patient from [facility] until if and when it is needed again. If it is decided to pick [facility] back up, they ask that we send new referral.

## 2022-06-23 ENCOUNTER — Ambulatory Visit (INDEPENDENT_AMBULATORY_CARE_PROVIDER_SITE_OTHER): Payer: Medicare PPO | Admitting: Orthopaedic Surgery

## 2022-06-23 ENCOUNTER — Encounter: Payer: Self-pay | Admitting: Orthopaedic Surgery

## 2022-06-23 VITALS — Ht 63.5 in | Wt 209.0 lb

## 2022-06-23 DIAGNOSIS — S86811A Strain of other muscle(s) and tendon(s) at lower leg level, right leg, initial encounter: Secondary | ICD-10-CM

## 2022-06-23 DIAGNOSIS — Z96651 Presence of right artificial knee joint: Secondary | ICD-10-CM

## 2022-06-23 NOTE — Progress Notes (Signed)
Post-Op Visit Note   Patient: Amy Curtis           Date of Birth: [birth year]           MRN: [id number] Visit Date: 06/23/2022 PCP: Berneda Rose, FNP   Assessment & Plan: Postop right total knee arthroplasty.  She had multiple falls after surgery.  Her granddaughter does shopping for her.  She had patellar tendon avulsion off the tibial tubercle with a fall after surgery and is ambulating in a knee immobilizer.  She uses her walker as instructed.  She states she does not want to have surgery we have discussed patellar tendon repair using graft.  She would like to proceed with a double upright long-leg brace inserted into her shoe with drop lock knee.  She can follow-up with me after she gets the brace.  Chief Complaint:  Chief Complaint  Patient presents with   Right Knee - Follow-up    05/07/2022 Right TKA with fall post op    Visit Diagnoses:  1. Patellar tendon rupture, right, initial encounter   2. S/P TKR (total knee replacement), right     Plan: Return after she gets her brace.  Follow-Up Instructions: No follow-ups on file.   Orders:  No orders of the defined types were placed in this encounter.  No orders of the defined types were placed in this encounter.   Imaging: No results found.  PMFS History: Patient Active Problem List   Diagnosis Date Noted   Closed fracture of proximal fibula 06/10/2022   Patellar tendon rupture, right, initial encounter 06/10/2022   Acute blood loss anemia 05/10/2022   S/P TKR (total knee replacement) 05/08/2022   S/P TKR (total knee replacement), right 05/07/2022   Unilateral primary osteoarthritis, right knee 04/24/2020   Quadriceps weakness 08/31/2018   Herniated nucleus pulposus, lumbar 07/05/2016   Lumbar herniated disc 07/05/2016   Status post total left knee replacement 03/26/2015   Spinal stenosis, lumbar region, with neurogenic claudication 11/05/2013   Lumbar spinal stenosis 11/05/2013   Past Medical History:   Diagnosis Date   Anxiety    Arthritis    "back, legs, knees" (03/27/2015)   Cataract    left eye   Depression    GERD (gastroesophageal reflux disease)    Glaucoma    R eye only but uses gtts in both eyes   H/O hiatal hernia    HNP (herniated nucleus pulposus)    right lumbar 4-5   HOH (hard of hearing)    Hypertension    Hypothyroidism    Migraines    "stopped when my periods stopped"   PONV (postoperative nausea and vomiting)    Shortness of breath    Sleep apnea    "tried CPAP; couldn't wear it"    Type II diabetes mellitus (HCC)    losing weight & careful dietary choices, no Rx (03/27/2015)   Wears dentures    Wears glasses     Family History  Problem Relation Age of Onset   Lung disease Mother    Lung disease Father     Past Surgical History:  Procedure Laterality Date   BACK SURGERY     CARPAL TUNNEL RELEASE Right    CATARACT EXTRACTION     COLONOSCOPY     DILATION AND CURETTAGE OF UTERUS  X 2   JOINT REPLACEMENT     LUMBAR DISC SURGERY  2011   done in [place]   LUMBAR LAMINECTOMY N/A 11/05/2013   Procedure: L3-4 Decompression;  Surgeon: Eldred Manges, MD;  Location: [facility] OR;  Service: Orthopedics;  Laterality: N/A;   LUMBAR LAMINECTOMY Right 07/05/2016   Procedure: RIGHT L-4 HEMILAMINECTOMY, LAMINOTOMY L-5 S-1 ;  Surgeon: Eldred Manges, MD;  [facility]: [facility];  Service: Orthopedics;  Laterality: Right;   MULTIPLE TOOTH EXTRACTIONS     SHOULDER ARTHROSCOPY W/ ROTATOR CUFF REPAIR Right ?2013   TONSILLECTOMY     TOTAL KNEE ARTHROPLASTY Left 03/26/2015   Procedure: TOTAL KNEE ARTHROPLASTY;  Surgeon: Eldred Manges, MD;  [facility]: [facility];  Service: Orthopedics;  Laterality: Left;   TOTAL KNEE ARTHROPLASTY Right 05/07/2022   Procedure: RIGHT TOTAL KNEE ARTHROPLASTY;  Surgeon: Eldred Manges, MD;  [facility]: [facility];  Service: Orthopedics;  Laterality: Right;  Needs RNFA   TUBAL LIGATION     Social History   Occupational History   Not on file  Tobacco Use   Smoking status:  Never   Smokeless tobacco: Never  Vaping Use   Vaping Use: Never used  Substance and Sexual Activity   Alcohol use: No   Drug use: No   Sexual activity: Never

## 2022-08-10 ENCOUNTER — Telehealth: Payer: Self-pay | Admitting: Orthopaedic Surgery

## 2022-08-10 NOTE — Telephone Encounter (Signed)
[facility] called. Says there was a order for a knee brace sent 7/16 for Dr. Ophelia Charter to sign and send back. They have not received it. Would like to know if Dr. Ophelia Charter got it. Her call back number [phone] if  the fax. Phone [phone]

## 2022-08-11 ENCOUNTER — Telehealth: Payer: Self-pay | Admitting: Orthopaedic Surgery

## 2022-08-11 NOTE — Telephone Encounter (Signed)
I have it. He just signed it 7/23. I will fax back today. Thanks

## 2022-08-11 NOTE — Telephone Encounter (Signed)
06/23/22 ov note faxed to [facility] [phone] with prescription

## 2022-08-11 NOTE — Telephone Encounter (Signed)
Noted. Thanks.

## 2022-12-23 ENCOUNTER — Other Ambulatory Visit (INDEPENDENT_AMBULATORY_CARE_PROVIDER_SITE_OTHER): Payer: Medicare PPO

## 2022-12-23 ENCOUNTER — Ambulatory Visit (INDEPENDENT_AMBULATORY_CARE_PROVIDER_SITE_OTHER): Payer: Medicare PPO | Admitting: Orthopaedic Surgery

## 2022-12-23 ENCOUNTER — Encounter: Payer: Self-pay | Admitting: Orthopaedic Surgery

## 2022-12-23 VITALS — Ht 63.5 in | Wt 219.0 lb

## 2022-12-23 DIAGNOSIS — M545 Low back pain, unspecified: Secondary | ICD-10-CM

## 2022-12-23 DIAGNOSIS — M25552 Pain in left hip: Secondary | ICD-10-CM

## 2022-12-23 NOTE — Progress Notes (Signed)
Office Visit Note   Patient: Amy Curtis           Date of Birth: 1940-05-06           MRN: [id number] Visit Date: 12/23/2022              Requested by: Berneda Rose, FNP [phone] [place],  [place] [address] PCP: Berneda Rose, FNP   Assessment & Plan: Visit Diagnoses:  1. Pain in left hip   2. Pain of lumbar spine     Plan: She can check at any local assisted living facilities in her area to maintain her options.  She can look at Meals on Wheels to help.  She needs to make sure she does not gain weight which we discussed.  She is in the best brace for the problem she has send unfortunately with her weight size age and previous total knee arthroplasty we discussed with her surgical repair for the avulsed tendon with inferior pole fracture has poor results and surgery is not recommended at this point.  She tried Ozempic but she states it felt like she was getting rocks in her stomach.  She was only on it for a few weeks and not really sure if she lost much weight.  Follow-Up Instructions: No follow-ups on file.   Orders:  Orders Placed This Encounter  Procedures   XR HIP UNILAT W OR W/O PELVIS 2-3 VIEWS LEFT   XR Lumbar Spine 2-3 Views   No orders of the defined types were placed in this encounter.     Procedures: No procedures performed   Clinical Data: No additional findings.   Subjective: Chief Complaint  Patient presents with   Lower Back - Pain   Left Hip - Pain    HPI 82 year old female returns post total knee arthroplasty with patellar tendon avulsion the inferior pole of the patella.  She is in a double upright straight leg brace and uses a walker.  She states it makes her feel better but she hates the fact that it length send her shoe which prevents ankle rotation.  She still lives in her house by herself but finds it harder to get things out of the fridge rater cooking etc.  BMI remains elevated.  She has multiple family members who live close.   She has not looked into assisted living options at this point.  Continues to have pain in their left hip pain in her back.  Patient states when she gets up at times she feels popping in her knee.  Review of Systems all systems updated unchanged.   Objective: Vital Signs: Ht 5' 3.5" (1.613 m)   Wt 219 lb (99.3 kg)   BMI 38.19 kg/m   Physical Exam Constitutional:      Appearance: She is well-developed.  HENT:     Head: Normocephalic.     Right Ear: External ear normal.     Left Ear: External ear normal. There is no impacted cerumen.  Eyes:     Pupils: Pupils are equal, round, and reactive to light.  Neck:     Thyroid: No thyromegaly.     Trachea: No tracheal deviation.  Cardiovascular:     Rate and Rhythm: Normal rate.  Pulmonary:     Effort: Pulmonary effort is normal.  Abdominal:     Palpations: Abdomen is soft.  Musculoskeletal:     Cervical back: No rigidity.  Skin:    General: Skin is warm and dry.  Neurological:  Mental Status: She is alert and oriented to person, place, and time.  Psychiatric:        Behavior: Behavior normal.     Ortho Exam patient is in a double upright drop likely knee long-leg brace.  Patella alta.  Trace knee effusion.  Collateral ligaments in her knee are stable.  Specialty Comments:  No specialty comments available.  Imaging: XR HIP UNILAT W OR W/O PELVIS 2-3 VIEWS LEFT  Result Date: 12/23/2022 AP pelvis x-rays to include hip obtained and reviewed this shows minimal joint space narrowing tiny spurs right and left hip symmetrical.  Decreased bone density suspected.  No acute changes in the pelvis or hips. Impression: Minimal hip osteoarthritis right and left.  XR Lumbar Spine 2-3 Views  Result Date: 12/23/2022 AP lateral lumbar spine images are obtained and reviewed.  Minimal spurring of the hips with maintained hip space.  Patient has right lumbar curvature with significant endplate spurring some reversal of normal lumbar lordosis  with multiple levels of retrolisthesis L2-3 L3-4 narrowing at L4-5 and L5-S1.  Some chronic wedging L1 and L2 which looks more degenerative. 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    PMFS History: Patient Active Problem List   Diagnosis Date Noted   Closed fracture of proximal fibula 06/10/2022   Patellar tendon rupture, right, initial encounter 06/10/2022   Acute blood loss anemia 05/10/2022   S/P TKR (total knee replacement) 05/08/2022   S/P TKR (total knee replacement), right 05/07/2022   Unilateral primary osteoarthritis, right knee 04/24/2020   Quadriceps weakness 08/31/2018   Herniated nucleus pulposus, lumbar 07/05/2016   Lumbar herniated disc 07/05/2016   Status post total left knee replacement 03/26/2015   Spinal stenosis, lumbar region, with neurogenic claudication 11/05/2013   Lumbar spinal stenosis 11/05/2013   Past Medical History:  Diagnosis Date   Anxiety    Arthritis    "back, legs, knees" (03/27/2015)   Cataract    left eye   Depression    GERD (gastroesophageal reflux disease)    Glaucoma    R eye only but uses gtts in both eyes   H/O hiatal hernia    HNP (herniated nucleus pulposus)    right lumbar 4-5   HOH (hard of hearing)    Hypertension    Hypothyroidism    Migraines    "stopped when my periods stopped"   PONV (postoperative nausea and vomiting)    Shortness of breath    Sleep apnea    "tried CPAP; couldn't wear it"    Type II diabetes mellitus (HCC)    losing weight & careful dietary choices, no Rx (03/27/2015)   Wears dentures    Wears glasses     Family History  Problem Relation Age of Onset   Lung disease Mother    Lung disease Father     Past Surgical History:  Procedure Laterality Date   BACK SURGERY     CARPAL TUNNEL RELEASE Right    CATARACT EXTRACTION     COLONOSCOPY     DILATION AND CURETTAGE OF UTERUS  X 2   JOINT REPLACEMENT     LUMBAR DISC SURGERY  2011   done in [place]    LUMBAR LAMINECTOMY N/A 11/05/2013   Procedure: L3-4 Decompression;  Surgeon: Eldred Manges, MD;  [facility]: [facility];  Service: Orthopedics;  Laterality: N/A;   LUMBAR LAMINECTOMY Right 07/05/2016   Procedure: RIGHT L-4 HEMILAMINECTOMY, LAMINOTOMY L-5 S-1 ;  Surgeon: Eldred Manges, MD;  Location: [facility];  Service: Orthopedics;  Laterality: Right;   MULTIPLE TOOTH EXTRACTIONS     SHOULDER ARTHROSCOPY W/ ROTATOR CUFF REPAIR Right ?2013   TONSILLECTOMY     TOTAL KNEE ARTHROPLASTY Left 03/26/2015   Procedure: TOTAL KNEE ARTHROPLASTY;  Surgeon: Eldred Manges, MD;  Location: [facility];  Service: Orthopedics;  Laterality: Left;   TOTAL KNEE ARTHROPLASTY Right 05/07/2022   Procedure: RIGHT TOTAL KNEE ARTHROPLASTY;  Surgeon: Eldred Manges, MD;  Location: [facility];  Service: Orthopedics;  Laterality: Right;  Needs RNFA   TUBAL LIGATION     Social History   Occupational History   Not on file  Tobacco Use   Smoking status: Never   Smokeless tobacco: Never  Vaping Use   Vaping status: Never Used  Substance and Sexual Activity   Alcohol use: No   Drug use: No   Sexual activity: Never
